# Patient Record
Sex: Female | Born: 1955
Health system: Southern US, Community
[De-identification: ages and names within clinical notes are randomized; demographics above are authoritative.]

## PROBLEM LIST (undated history)

## (undated) HISTORY — PX: APPENDECTOMY: SHX54

---

## 1999-10-20 ENCOUNTER — Ambulatory Visit (HOSPITAL_BASED_OUTPATIENT_CLINIC_OR_DEPARTMENT_OTHER): Admission: RE | Admit: 1999-10-20 | Discharge: 1999-10-20 | Payer: Self-pay | Admitting: *Deleted

## 2006-01-23 ENCOUNTER — Inpatient Hospital Stay (HOSPITAL_COMMUNITY): Admission: EM | Admit: 2006-01-23 | Discharge: 2006-01-26 | Payer: Self-pay | Admitting: Emergency Medicine

## 2007-03-15 IMAGING — CR DG ABDOMEN 1V
1 series · 1 of 1 positions shown · non-contrast
Comparison: None.

CLINICAL DATA: 49-year-old with nausea, vomiting.
 ABDOMEN - 1 VIEW:

[view not recorded]
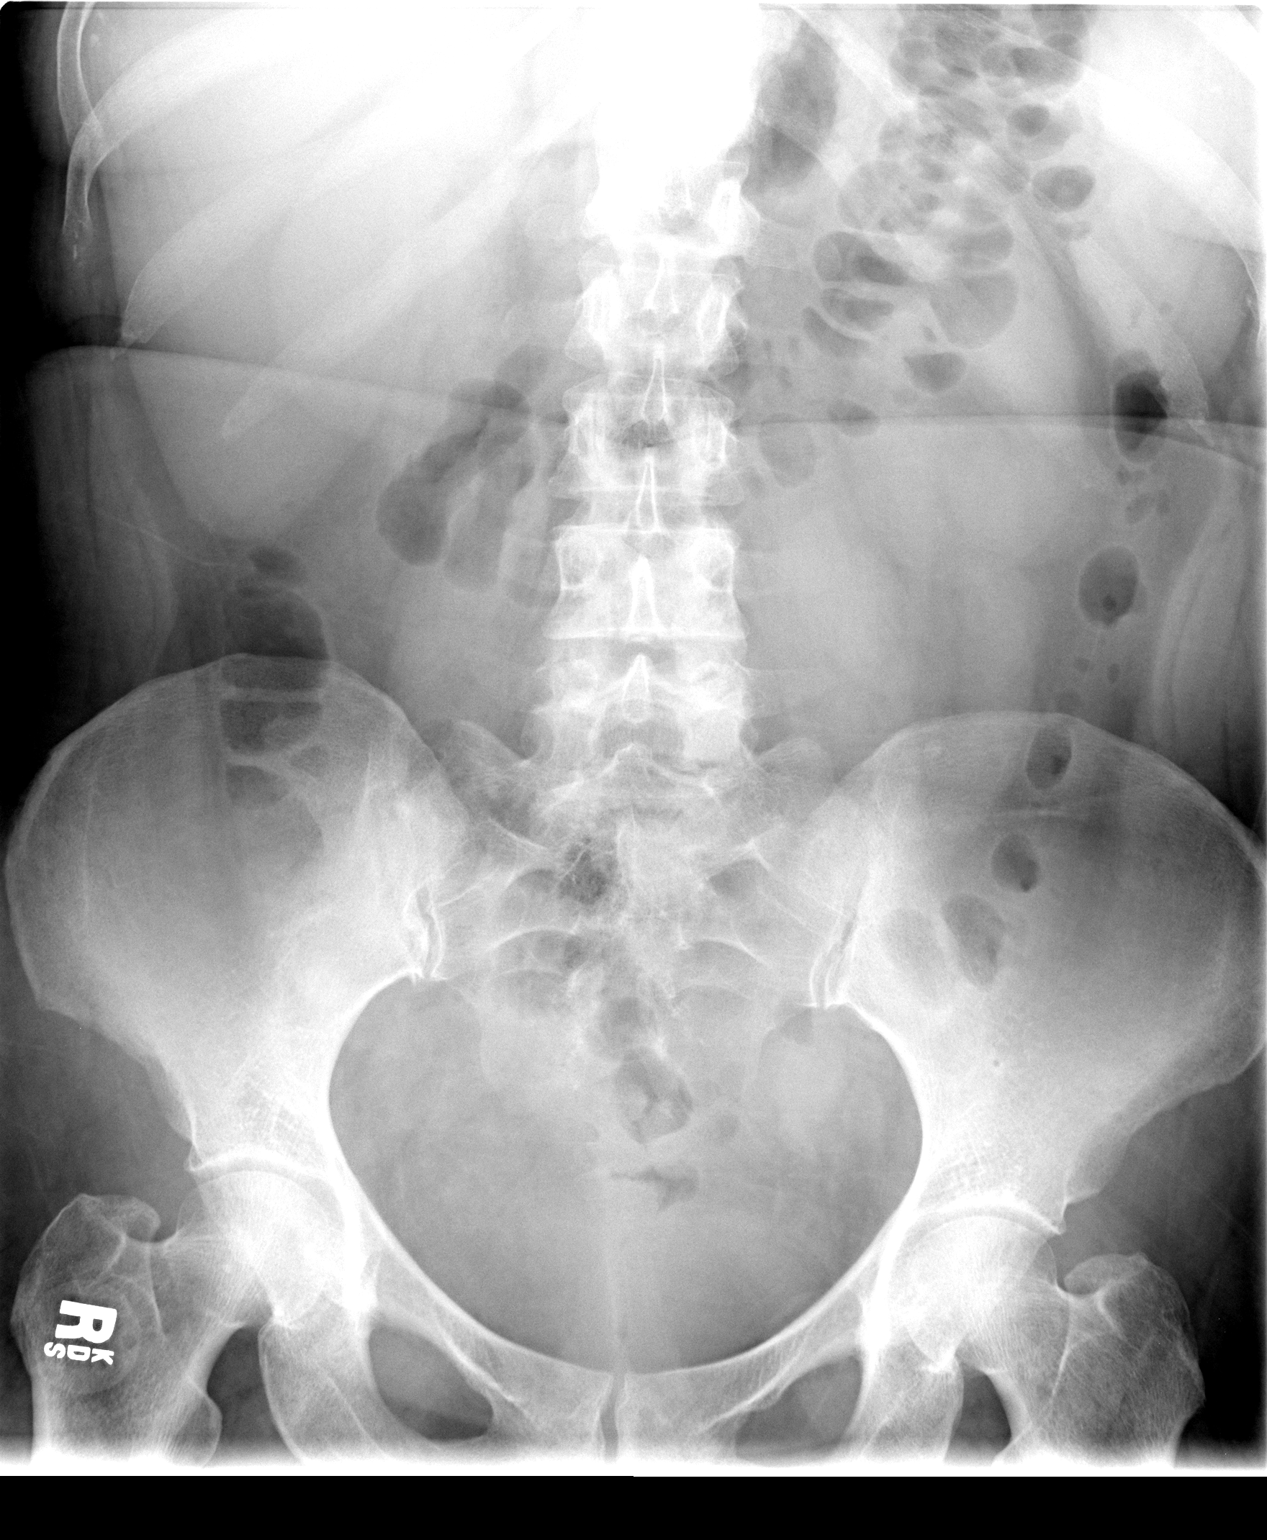

[1 of 1 positions shown; findings below may reference images not displayed]

FINDINGS: The bowel gas pattern is unremarkable.  There is scattered air in the colon.  No dilated loops of small bowel to suggest obstruction.  The soft tissue shadows of the abdomen are maintained.  No worrisome calcifications are seen.  Vague rounded density over the left iliac crest area may be a bone island.  It?s too lateral to be in the ureter.  Bony structures are unremarkable.
IMPRESSION: No plain film evidence of acute abdominal process.

## 2019-05-23 ENCOUNTER — Encounter (HOSPITAL_COMMUNITY): Payer: Self-pay | Admitting: Emergency Medicine

## 2019-05-23 ENCOUNTER — Emergency Department (HOSPITAL_COMMUNITY)
Admission: EM | Admit: 2019-05-23 | Discharge: 2019-05-23 | Disposition: A | Discharge status: Home / Self Care | Payer: 59 | Attending: Emergency Medicine | Admitting: Emergency Medicine

## 2019-05-23 ENCOUNTER — Other Ambulatory Visit: Payer: Self-pay

## 2019-05-23 DIAGNOSIS — L989 Disorder of the skin and subcutaneous tissue, unspecified: Secondary | ICD-10-CM | POA: Insufficient documentation

## 2019-05-23 DIAGNOSIS — R2242 Localized swelling, mass and lump, left lower limb: Secondary | ICD-10-CM | POA: Diagnosis present

## 2019-05-23 DIAGNOSIS — L280 Lichen simplex chronicus: Secondary | ICD-10-CM | POA: Insufficient documentation

## 2019-05-23 DIAGNOSIS — L03116 Cellulitis of left lower limb: Secondary | ICD-10-CM | POA: Insufficient documentation

## 2019-05-23 DIAGNOSIS — L539 Erythematous condition, unspecified: Secondary | ICD-10-CM | POA: Insufficient documentation

## 2019-05-23 LAB — COMPREHENSIVE METABOLIC PANEL
ALT: 18 U/L (ref 0–44)
AST: 17 U/L (ref 15–41)
Albumin: 4.2 g/dL (ref 3.5–5.0)
Alkaline Phosphatase: 117 U/L (ref 38–126)
Anion gap: 13 (ref 5–15)
BUN: 13 mg/dL (ref 8–23)
CO2: 18 mmol/L — ABNORMAL LOW (ref 22–32)
Calcium: 9.5 mg/dL (ref 8.9–10.3)
Chloride: 106 mmol/L (ref 98–111)
Creatinine, Ser: 1.11 mg/dL — ABNORMAL HIGH (ref 0.44–1.00)
GFR calc Af Amer: >60 mL/min (ref >60)
GFR calc non Af Amer: 53 mL/min — ABNORMAL LOW (ref >60)
Glucose, Bld: 129 mg/dL — ABNORMAL HIGH (ref 70–99)
Potassium: 4.1 mmol/L (ref 3.5–5.1)
Sodium: 137 mmol/L (ref 135–145)
Total Bilirubin: 0.9 mg/dL (ref 0.3–1.2)
Total Protein: 7.8 g/dL (ref 6.5–8.1)

## 2019-05-23 LAB — LACTIC ACID, PLASMA: Lactic Acid, Venous: 1.6 mmol/L (ref 0.5–1.9)

## 2019-05-23 LAB — CBC WITH DIFFERENTIAL/PLATELET
Abs Immature Granulocytes: 0.14 10*3/uL — ABNORMAL HIGH (ref 0.00–0.07)
Basophils Absolute: 0.1 10*3/uL (ref 0.0–0.1)
Basophils Relative: 1 %
Eosinophils Absolute: 0.1 10*3/uL (ref 0.0–0.5)
Eosinophils Relative: 1 %
HCT: 47.4 % — ABNORMAL HIGH (ref 36.0–46.0)
Hemoglobin: 15 g/dL (ref 12.0–15.0)
Immature Granulocytes: 1 %
Lymphocytes Relative: 15 %
Lymphs Abs: 2 10*3/uL (ref 0.7–4.0)
MCH: 27.3 pg (ref 26.0–34.0)
MCHC: 31.6 g/dL (ref 30.0–36.0)
MCV: 86.2 fL (ref 80.0–100.0)
Monocytes Absolute: 0.7 10*3/uL (ref 0.1–1.0)
Monocytes Relative: 5 %
Neutro Abs: 10.7 10*3/uL — ABNORMAL HIGH (ref 1.7–7.7)
Neutrophils Relative %: 77 %
Platelets: 406 10*3/uL — ABNORMAL HIGH (ref 150–400)
RBC: 5.5 MIL/uL — ABNORMAL HIGH (ref 3.87–5.11)
RDW: 13.7 % (ref 11.5–15.5)
WBC: 13.7 10*3/uL — ABNORMAL HIGH (ref 4.0–10.5)
nRBC: 0 % (ref 0.0–0.2)

## 2019-05-23 MED ORDER — SODIUM CHLORIDE 0.9% FLUSH: 3.0000 mL | Freq: Once | INTRAVENOUS | Status: DC

## 2019-05-23 MED ORDER — CLINDAMYCIN HCL 150 MG PO CAPS: 300.0000 mg | ORAL_CAPSULE | Freq: Three times a day (TID) | ORAL | 0 refills | Status: AC

## 2019-05-23 MED ORDER — CLINDAMYCIN HCL 150 MG PO CAPS
300.0000 mg | ORAL_CAPSULE | Freq: Once | ORAL | Status: AC
  Administered 2019-05-23: 15:00:00 300 mg via ORAL
  Filled 2019-05-23: qty 2

## 2019-05-23 NOTE — ED Notes (Signed)
Provider at bedside

## 2019-05-23 NOTE — ED Provider Notes (Signed)
[place] EMERGENCY DEPARTMENT Provider Note   CSN: [id number] Arrival date & time: 05/23/19  1035     History Chief Complaint  Patient presents with  . Cellulitis    Kathryn Schmitt is a 64 y.o. female.  HPI    Patient presents with concern of left lower extremity weeping, discharge, odor. Patient has a history of lymphedema bilaterally, though more substantially on the left. She has had worsening condition in general over the past months. She notes that she has been on several courses of antibiotics, most recently 1 month ago, Keflex. She now presents due to concern of increasing swelling, erythema, discharge from the left calf. This episode began about 1 week ago, has been persistent. No clear alleviating or exacerbating factors. No distal loss of function. No systemic complaints including fever, chills, nausea, vomiting, other discomfort. She typically gets her care through telemedicine during the pandemic, has not seen a wound care clinic.   History reviewed. No pertinent past medical history.  There are no problems to display for this patient.   Past Surgical History:  Procedure Laterality Date  . APPENDECTOMY     2007     OB History   No obstetric history on file.     No family history on file.  Social History   Tobacco Use  . Smoking status: Not on file  Substance Use Topics  . Alcohol use: Not on file  . Drug use: Not on file    Home Medications Prior to Admission medications   Not on File    Allergies    Patient has no allergy information on record.  Review of Systems   Review of Systems  Constitutional:       Per HPI, otherwise negative  HENT:       Per HPI, otherwise negative  Respiratory:       Per HPI, otherwise negative  Cardiovascular:       Per HPI, otherwise negative  Gastrointestinal: Negative for vomiting.  Endocrine:       Negative aside from HPI  Genitourinary:       Neg aside from HPI    Musculoskeletal:       Per HPI, otherwise negative  Skin: Positive for color change and wound.  Neurological: Negative for syncope.    Physical Exam Updated Vital Signs BP 137/80   Pulse 89   Temp 98.2 F (36.8 C) (Oral)   Resp 16   SpO2 100%   Physical Exam Vitals and nursing note reviewed.  Constitutional:      General: She is not in acute distress.    Appearance: She is well-developed.  HENT:     Head: Normocephalic and atraumatic.  Eyes:     Conjunctiva/sclera: Conjunctivae normal.  Cardiovascular:     Rate and Rhythm: Normal rate and regular rhythm.     Pulses: Normal pulses.  Pulmonary:     Effort: Pulmonary effort is normal. No respiratory distress.     Breath sounds: No stridor.  Abdominal:     General: There is no distension.  Musculoskeletal:       Legs:  Skin:    General: Skin is warm and dry.  Neurological:     Mental Status: She is alert and oriented to person, place, and time.     Cranial Nerves: No cranial nerve deficit.     ED Results / Procedures / Treatments   Labs (all labs ordered are listed, but only abnormal results are displayed)  Labs Reviewed  COMPREHENSIVE METABOLIC PANEL - Abnormal; Notable for the following components:      Result Value   CO2 18 (*)    Glucose, Bld 129 (*)    Creatinine, Ser 1.11 (*)    GFR calc non Af Amer 53 (*)    All other components within normal limits  CBC WITH DIFFERENTIAL/PLATELET - Abnormal; Notable for the following components:   WBC 13.7 (*)    RBC 5.50 (*)    HCT 47.4 (*)    Platelets 406 (*)    Neutro Abs 10.7 (*)    Abs Immature Granulocytes 0.14 (*)    All other components within normal limits  CULTURE, BLOOD (ROUTINE X 2)  CULTURE, BLOOD (ROUTINE X 2)  LACTIC ACID, PLASMA  LACTIC ACID, PLASMA    EKG None  Radiology No results found.  Procedures Procedures (including critical care time)  Medications Ordered in ED Medications  sodium chloride flush (NS) 0.9 % injection 3 mL (has  no administration in time range)    ED Course  I have reviewed the triage vital signs and the nursing notes.  Pertinent labs & imaging results that were available during my care of the patient were reviewed by me and considered in my medical decision making (see chart for details).    MDM Rules/Calculators/A&P                      1:54 PM Labs unremarkable. With no evidence for bacteremia or sepsis, only mild leukocytosis, no lactic acidosis, no fever, patient is appropriate forInitiation of oral therapy, different from what she has been using.  Patient has not yet seen wound care clinic, will receive referrals to do so as well.   Final Clinical Impression(s) / ED Diagnoses Final diagnoses:  Cellulitis of left lower extremity  Lichenification    Rx / DC Orders ED Discharge Orders         Ordered    clindamycin (CLEOCIN) 150 MG capsule  3 times daily     05/23/19 1357           Gerhard Munch, MD 05/23/19 1357

## 2019-05-23 NOTE — ED Triage Notes (Signed)
Pt endorses cellulitis in left leg that she has been battling for 6 months. States there is open wounds and weeping. Has completed abx but wound has not improved.

## 2019-05-23 NOTE — Discharge Instructions (Signed)
As discussed, it is very portly follow-up with our wound care center for appropriate ongoing management of your lower extremity wounds. Please take your prescribed antibiotics, monitor your condition carefully, and do not hesitate to return here for concerning changes.

## 2019-05-28 LAB — CULTURE, BLOOD (ROUTINE X 2)
Culture: NO GROWTH
Culture: NO GROWTH
Special Requests: ADEQUATE

## 2019-06-02 ENCOUNTER — Other Ambulatory Visit: Payer: Self-pay

## 2019-06-02 ENCOUNTER — Emergency Department (HOSPITAL_COMMUNITY)
Admission: EM | Admit: 2019-06-02 | Discharge: 2019-06-02 | Disposition: A | Discharge status: Home / Self Care | Payer: 59 | Attending: Emergency Medicine | Admitting: Emergency Medicine

## 2019-06-02 ENCOUNTER — Encounter (HOSPITAL_COMMUNITY): Payer: Self-pay

## 2019-06-02 DIAGNOSIS — Z20822 Contact with and (suspected) exposure to covid-19: Secondary | ICD-10-CM | POA: Diagnosis not present

## 2019-06-02 DIAGNOSIS — L03119 Cellulitis of unspecified part of limb: Secondary | ICD-10-CM

## 2019-06-02 DIAGNOSIS — I872 Venous insufficiency (chronic) (peripheral): Secondary | ICD-10-CM | POA: Insufficient documentation

## 2019-06-02 DIAGNOSIS — Z79899 Other long term (current) drug therapy: Secondary | ICD-10-CM | POA: Diagnosis not present

## 2019-06-02 DIAGNOSIS — R21 Rash and other nonspecific skin eruption: Secondary | ICD-10-CM | POA: Diagnosis present

## 2019-06-02 LAB — CBC WITH DIFFERENTIAL/PLATELET
Abs Immature Granulocytes: 0.1 10*3/uL — ABNORMAL HIGH (ref 0.00–0.07)
Basophils Absolute: 0.1 10*3/uL (ref 0.0–0.1)
Basophils Relative: 0 %
Eosinophils Absolute: 0 10*3/uL (ref 0.0–0.5)
Eosinophils Relative: 0 %
HCT: 41.7 % (ref 36.0–46.0)
Hemoglobin: 13.3 g/dL (ref 12.0–15.0)
Immature Granulocytes: 1 %
Lymphocytes Relative: 8 %
Lymphs Abs: 0.9 10*3/uL (ref 0.7–4.0)
MCH: 27.1 pg (ref 26.0–34.0)
MCHC: 31.9 g/dL (ref 30.0–36.0)
MCV: 85.1 fL (ref 80.0–100.0)
Monocytes Absolute: 0.7 10*3/uL (ref 0.1–1.0)
Monocytes Relative: 6 %
Neutro Abs: 9.8 10*3/uL — ABNORMAL HIGH (ref 1.7–7.7)
Neutrophils Relative %: 85 %
Platelets: 234 10*3/uL (ref 150–400)
RBC: 4.9 MIL/uL (ref 3.87–5.11)
RDW: 14.1 % (ref 11.5–15.5)
WBC: 11.6 10*3/uL — ABNORMAL HIGH (ref 4.0–10.5)
nRBC: 0 % (ref 0.0–0.2)

## 2019-06-02 LAB — SARS CORONAVIRUS 2 (TAT 6-24 HRS): SARS Coronavirus 2: NEGATIVE

## 2019-06-02 LAB — BASIC METABOLIC PANEL
Anion gap: 11 (ref 5–15)
BUN: 14 mg/dL (ref 8–23)
CO2: 19 mmol/L — ABNORMAL LOW (ref 22–32)
Calcium: 8.6 mg/dL — ABNORMAL LOW (ref 8.9–10.3)
Chloride: 99 mmol/L (ref 98–111)
Creatinine, Ser: 1.08 mg/dL — ABNORMAL HIGH (ref 0.44–1.00)
GFR calc Af Amer: >60 mL/min (ref >60)
GFR calc non Af Amer: 55 mL/min — ABNORMAL LOW (ref >60)
Glucose, Bld: 143 mg/dL — ABNORMAL HIGH (ref 70–99)
Potassium: 4.1 mmol/L (ref 3.5–5.1)
Sodium: 129 mmol/L — ABNORMAL LOW (ref 135–145)

## 2019-06-02 LAB — LACTIC ACID, PLASMA: Lactic Acid, Venous: 1.2 mmol/L (ref 0.5–1.9)

## 2019-06-02 MED ORDER — BACITRACIN ZINC 500 UNIT/GM EX OINT: TOPICAL_OINTMENT | Freq: Once | CUTANEOUS | Status: AC

## 2019-06-02 MED ORDER — TRIAMCINOLONE ACETONIDE 0.1 % EX CREA: 1.0000 "application " | TOPICAL_CREAM | Freq: Two times a day (BID) | CUTANEOUS | 0 refills | Status: AC

## 2019-06-02 MED ORDER — DOXYCYCLINE HYCLATE 100 MG PO CAPS: 100.0000 mg | ORAL_CAPSULE | Freq: Two times a day (BID) | ORAL | 0 refills | Status: AC

## 2019-06-02 NOTE — ED Triage Notes (Signed)
Patient complains of general body aches and rash on right foot x 2 days. States that she thinks she is having allergic reaction to clindamycin. Finished med on Friday, NAD

## 2019-06-02 NOTE — Discharge Instructions (Signed)
You are seen today for skin changes in your lower legs.  Although your white blood cell count looked improved from your last visit, I think that you still may have an infection and so I have started you on doxycycline.  I have also given you a topical steroid medication which can help with some of the skin changes as well.  Please make sure you see the wound clinic in 1 week as you are scheduled.Thank you for allowing me to care for you today. Please return to the emergency department if you have new or worsening symptoms. Take your medications as instructed.

## 2019-06-02 NOTE — ED Provider Notes (Addendum)
[facility] EMERGENCY DEPARTMENT Provider Note   CSN: [id number] Arrival date & time: 06/02/19  8099     History Chief Complaint  Patient presents with  . possible allergic rx/ rash    Kathryn Schmitt is a 64 y.o. female.  Patient is a 63 year old female with no listed past medical history presenting to the emergency department for bilateral lower extremity swelling and weeping.  Patient reports that this has been going on for several months.  She reports that she does not have a primary care doctor and has been on a few courses of antibiotics which were prescribed by telemedicine as well as most recently on clindamycin after a visit here to the emergency department on 22 January.  She reports that she completed the clindamycin on Friday and this morning she noticed a rash on the dorsum of her right foot.  She does state that overall the swelling and erythema looks much better after the clindamycin but it is still present with weeping.  Reports that the left side swelling and weeping has been worse than the right side for several months.  She reports that the rash on the right foot is new and she also has been feeling more generally weak and fatigued.  Reports a slight cough but denies any recorded fevers, chills, nausea, vomiting, chest pain, shortness of breath.  No known Covid exposure.        History reviewed. No pertinent past medical history.  There are no problems to display for this patient.   Past Surgical History:  Procedure Laterality Date  . APPENDECTOMY     2007     OB History   No obstetric history on file.     No family history on file.  Social History   Tobacco Use  . Smoking status: Not on file  Substance Use Topics  . Alcohol use: Not on file  . Drug use: Not on file    Home Medications Prior to Admission medications   Medication Sig Start Date End Date Taking? Authorizing Provider  acetaminophen (TYLENOL) 500 MG tablet  Take 1,000 mg by mouth every 6 (six) hours as needed for mild pain.   Yes [provider]  clindamycin (CLEOCIN) 150 MG capsule Take 300 mg by mouth 3 (three) times daily.   Yes [provider]  Probiotic Product (PROBIOTIC ACIDOPHILUS BEADS) CAPS Take 1 capsule by mouth daily.   Yes [provider]  doxycycline (VIBRAMYCIN) 100 MG capsule Take 1 capsule (100 mg total) by mouth 2 (two) times daily. 06/02/19   Ronnie Doss A, PA-C  triamcinolone cream (KENALOG) 0.1 % Apply 1 application topically 2 (two) times daily. 06/02/19   Arlyn Dunning, PA-C    Allergies    Patient has no known allergies.  Review of Systems   Review of Systems  Constitutional: Positive for fatigue. Negative for appetite change, chills, diaphoresis and fever.  HENT: Negative for congestion and sore throat.   Respiratory: Positive for cough. Negative for shortness of breath.   Cardiovascular: Negative for chest pain.  Gastrointestinal: Negative for abdominal pain, nausea and vomiting.  Genitourinary: Negative for dysuria.  Musculoskeletal: Negative for arthralgias, back pain, gait problem and myalgias.  Skin: Positive for color change, rash and wound. Negative for pallor.  Allergic/Immunologic: Negative for immunocompromised state.  Neurological: Positive for weakness (Generalized).  Hematological: Does not bruise/bleed easily.    Physical Exam Updated Vital Signs BP 122/75 (BP Location: Right Arm)   Pulse 90  Temp (!) 97.5 F (36.4 C) (Oral)   Resp 20   SpO2 99%   Physical Exam Vitals and nursing note reviewed.  Constitutional:      General: She is not in acute distress.    Appearance: Normal appearance. She is obese. She is not ill-appearing, toxic-appearing or diaphoretic.  HENT:     Head: Normocephalic.     Mouth/Throat:     Mouth: Mucous membranes are moist.  Eyes:     Conjunctiva/sclera: Conjunctivae normal.  Pulmonary:     Effort: Pulmonary effort is normal. No  respiratory distress.  Musculoskeletal:     Right lower leg: Edema present.     Left lower leg: Edema present.     Comments: Bilateral severe lower extremity edema, worse on the left.  Left lower extremity with weeping edema and erythema and venous stasis appearing skin changes with scaling and excoriations.  Malodorous yellow weeping.  The right dorsum of the foot has a few scattered, blanching macules with surrounding erythema.  Bilateral lower extremity pulses are present with warm and perfused toes.  No blistering, sloughing of the skin  Skin:    General: Skin is dry.     Capillary Refill: Capillary refill takes less than 2 seconds.  Neurological:     Mental Status: She is alert.     Sensory: No sensory deficit.  Psychiatric:        Mood and Affect: Mood normal.     ED Results / Procedures / Treatments   Labs (all labs ordered are listed, but only abnormal results are displayed) Labs Reviewed  BASIC METABOLIC PANEL - Abnormal; Notable for the following components:      Result Value   Sodium 129 (*)    CO2 19 (*)    Glucose, Bld 143 (*)    Creatinine, Ser 1.08 (*)    Calcium 8.6 (*)    GFR calc non Af Amer 55 (*)    All other components within normal limits  CBC WITH DIFFERENTIAL/PLATELET - Abnormal; Notable for the following components:   WBC 11.6 (*)    Neutro Abs 9.8 (*)    Abs Immature Granulocytes 0.10 (*)    All other components within normal limits  SARS CORONAVIRUS 2 (TAT 6-24 HRS)  LACTIC ACID, PLASMA    EKG None  Radiology No results found.  Procedures Procedures (including critical care time)  Medications Ordered in ED Medications  bacitracin ointment ( Topical Given 06/02/19 1128)    ED Course  I have reviewed the triage vital signs and the nursing notes.  Pertinent labs & imaging results that were available during my care of the patient were reviewed by me and considered in my medical decision making (see chart for details).  Clinical Course as  of Jun 05 838  Mon Jun 02, 2019  1145 Patient presenting with follow-up of lower leg cellulitis and new rash on the right foot.  Patient recently on clindamycin for cellulitis.  Reports erythema is actually improved but has new rash on the right dorsum of her foot.  Does not appear to be a severe drug reaction.  She does have chronic venous stasis skin changes.  Lab results reveal an improved white blood cell count but still a mild leukocytosis of 11.6.  Today she is hyponatremic to 129.  Remainder of her labs are baseline.  She does not have a listed history of diabetes but her glucose is 143 today.  Lactic acid is normal.  Due to her  cough a Covid swab was obtained.  This is a send out lab.  Patient has follow-up with wound care on next Monday.  I am going to start her on a topical steroid for venous stasis as well as doxycycline as she is still having foul-smelling drainage.  Her wounds were cleaned and dressed here in the emergency department.  Patient was also advised of her additional labs including low sodium, mildly elevated creatinine and elevated glucose.  Advised to follow-up with primary care doctor to follow these.  She was advised on strict return precautions.     [KM]    Clinical Course User Index [KM] Kristine Royal   MDM Rules/Calculators/A&P                      Based on review of vitals, medical screening exam, lab work and/or imaging, there does not appear to be an acute, emergent etiology for the patient's symptoms. Counseled pt on good return precautions and encouraged both PCP and ED follow-up as needed.  Prior to discharge, I also discussed incidental imaging findings with patient in detail and advised appropriate, recommended follow-up in detail.  Clinical Impression: 1. Cellulitis of lower extremity, unspecified laterality   2. Venous stasis dermatitis of both lower extremities     Disposition: Discharge  Prior to providing a prescription for a controlled  substance, I independently reviewed the patient's recent prescription history on the [place]. The patient had no recent or regular prescriptions and was deemed appropriate for a brief, less than 3 day prescription of narcotic for acute analgesia.  This note was prepared with assistance of Systems analyst. Occasional wrong-word or sound-a-like substitutions may have occurred due to the inherent limitations of voice recognition software.  Final Clinical Impression(s) / ED Diagnoses Final diagnoses:  Cellulitis of lower extremity, unspecified laterality  Venous stasis dermatitis of both lower extremities    Rx / DC Orders ED Discharge Orders         Ordered    triamcinolone cream (KENALOG) 0.1 %  2 times daily     06/02/19 1224    doxycycline (VIBRAMYCIN) 100 MG capsule  2 times daily     06/02/19 [address] 06/02/19 1225    [address], PA-C 06/06/19 1610    Charlesetta Shanks, MD 06/09/19 [phone]

## 2019-06-09 ENCOUNTER — Encounter (HOSPITAL_BASED_OUTPATIENT_CLINIC_OR_DEPARTMENT_OTHER): Payer: 59 | Attending: Internal Medicine | Admitting: Internal Medicine

## 2019-06-09 ENCOUNTER — Other Ambulatory Visit: Payer: Self-pay

## 2019-06-09 DIAGNOSIS — Z836 Family history of other diseases of the respiratory system: Secondary | ICD-10-CM | POA: Diagnosis not present

## 2019-06-09 DIAGNOSIS — L97221 Non-pressure chronic ulcer of left calf limited to breakdown of skin: Secondary | ICD-10-CM | POA: Insufficient documentation

## 2019-06-09 DIAGNOSIS — Z823 Family history of stroke: Secondary | ICD-10-CM | POA: Insufficient documentation

## 2019-06-09 DIAGNOSIS — I10 Essential (primary) hypertension: Secondary | ICD-10-CM | POA: Insufficient documentation

## 2019-06-09 DIAGNOSIS — Z881 Allergy status to other antibiotic agents status: Secondary | ICD-10-CM | POA: Insufficient documentation

## 2019-06-09 DIAGNOSIS — I890 Lymphedema, not elsewhere classified: Secondary | ICD-10-CM | POA: Diagnosis not present

## 2019-06-09 DIAGNOSIS — L97211 Non-pressure chronic ulcer of right calf limited to breakdown of skin: Secondary | ICD-10-CM | POA: Insufficient documentation

## 2019-06-09 DIAGNOSIS — I252 Old myocardial infarction: Secondary | ICD-10-CM | POA: Diagnosis not present

## 2019-06-09 DIAGNOSIS — Z6841 Body Mass Index (BMI) 40.0 and over, adult: Secondary | ICD-10-CM | POA: Insufficient documentation

## 2019-06-09 DIAGNOSIS — D649 Anemia, unspecified: Secondary | ICD-10-CM | POA: Insufficient documentation

## 2019-06-09 DIAGNOSIS — I872 Venous insufficiency (chronic) (peripheral): Secondary | ICD-10-CM | POA: Insufficient documentation

## 2019-06-09 DIAGNOSIS — Z809 Family history of malignant neoplasm, unspecified: Secondary | ICD-10-CM | POA: Diagnosis not present

## 2019-06-09 NOTE — Progress Notes (Signed)
[name], [name] ([id number]) Visit Report for 06/09/2019 Abuse/Suicide Risk Screen Details Date of Service: Kathryn Schmitt, Kathryn Schmitt 06/09/2019 2:45 PM [name] Patient Account Number: [id number] Medical Record Treating RN: Yevonne Pax [phone] Number: Other Clinician: Date of Birth/Sex: 11/03/1955 (64 y.o. F) Treating Sanjuana Mruk/Extender:Robson, Casimiro Needle Primary Care Surena Welge: PATIENT, NO Referring Maelani Yarbro: Weeks in Treatment: 0 Abuse/Suicide Risk Screen Items Answer ABUSE RISK SCREEN: Has anyone close to you tried to hurt or harm you recentlyo No Do you feel uncomfortable with anyone in your familyo No Has anyone forced you do things that you didnt want to doo No Electronic Signature(s) Signed: 06/09/2019 5:29:28 PM By: Yevonne Pax RN Entered By: Yevonne Pax on 06/09/2019 15:19:21 Activities of Daily Living Details Date of Service: -------------------------------------------------------------------------------- Kathryn, Schmitt 06/09/2019 2:45 PM [name] Patient Account Number: [id number] Medical Record Treating RN: Yevonne Pax [phone] Number: Other Clinician: Date of Birth/Sex: 09/07/1955 (64 y.o. F) Treating Kathryn Schmitt/Extender:Robson, Casimiro Needle Primary Care Kathryn Schmitt: PATIENT, NO Referring Athony Coppa: Tania Ade in Treatment: 0 Activities of Daily Living Items Answer Activities of Daily Living (Please select one for each item) Drive Automobile Completely Able Take Medications Completely Able Use Telephone Completely Able Care for Appearance Completely Able Use Toilet Completely Able Bath / Shower Completely Able Dress Self Completely Able Feed Self Completely Able Walk Completely Able Get In / Out Bed Completely Able Housework Completely Able Prepare Meals Completely Able Handle Money Completely Able Shop for Self Completely Able Electronic Signature(s) Signed: 06/09/2019 5:29:28 PM By: Yevonne Pax RN Entered By: Yevonne Pax on 06/09/2019  15:19:46 Education Screening Details Date of Service: -------------------------------------------------------------------------------- Kathryn Schmitt 06/09/2019 2:45 PM [name] Patient Account Number: [id number] Medical Record Treating RN: Yevonne Pax [phone] Number: Other Clinician: Date of Birth/Sex: July 14, 1955 (64 y.o. F) Treating Kathryn Schmitt/Extender:Robson, Casimiro Needle Primary Care Kathryn Schmitt: PATIENT, NO Referring Kathryn Schmitt: Tania Ade in Treatment: 0 Primary Learner Assessed: Patient Learning Preferences/Education Level/Primary Language Learning Preference: Explanation Highest Education Level: High School Preferred Language: English Cognitive Barrier Language Barrier: No Translator Needed: No Memory Deficit: No Emotional Barrier: No Cultural/Religious Beliefs Affecting Medical Care: No Physical Barrier Impaired Vision: Yes Glasses Impaired Hearing: No Decreased Hand dexterity: No Knowledge/Comprehension Knowledge Level: Medium Comprehension Level: High Ability to understand written High instructions: Ability to understand verbal High instructions: Motivation Anxiety Level: Calm Cooperation: Cooperative Education Importance: Acknowledges Need Interest in Health Problems: Asks Questions Perception: Coherent Willingness to Engage in Self- High Management Activities: Readiness to Engage in Self- High Management Activities: Electronic Signature(s) Signed: 06/09/2019 5:29:28 PM By: Yevonne Pax RN Entered By: Yevonne Pax on 06/09/2019 15:20:20 Fall Risk Assessment Details Date of Service: -------------------------------------------------------------------------------- Kathryn Schmitt 06/09/2019 2:45 PM [name] Patient Account Number: [id number] Medical Record Treating RN: Yevonne Pax [phone] Number: Other Clinician: Date of Birth/Sex: [birth year] (64 y.o. F) Treating Clovia Reine/Extender:Robson, Casimiro Needle Primary Care Kathryn Schmitt: PATIENT,  NO Referring Kathryn Schmitt: Tania Ade in Treatment: 0 Fall Risk Assessment Items Have you had 2 or more falls in the last 12 monthso 0 No Have you had any fall that resulted in injury in the last 12 monthso 0 No FALLS RISK SCREEN History of falling - immediate or within 3 months 0 No Secondary diagnosis (Do you have 2 or more medical diagnoseso) 0 No Ambulatory aid None/bed rest/wheelchair/nurse 0 No Crutches/cane/walker 0 No Furniture 0 No Intravenous therapy Access/Saline/Heparin Lock 0 No Gait/Transferring Normal/ bed rest/ wheelchair 0 No Impaired (short steps with shuffle, may have difficulty arising from chair, 0 No head down, impaired balance) Mental Status Oriented to own ability 0 No Overestimates  or forgets limitations 0 No Risk Level: Low Risk Score: 0 Electronic Signature(s) Signed: 06/09/2019 5:29:28 PM By: Carlene Coria RN Entered By: Carlene Coria on 06/09/2019 15:20:29 Foot Assessment Details Date of Service: -------------------------------------------------------------------------------- Kathryn Schmitt 06/09/2019 2:45 PM [name] Patient Account Number: [id number] Medical Record Treating RN: Carlene Coria [phone] Number: Other Clinician: Date of Birth/Sex: 10/01/1955 (64 y.o. F) Treating Kathryn Schmitt/Extender:Robson, Legrand Como Primary Care Machele Deihl: PATIENT, NO Referring Sonam Huelsmann: Weeks in Treatment: 0 Foot Assessment Items Site Locations + = Sensation present, - = Sensation absent, C = Callus, U = Ulcer R = Redness, W = Warmth, M = Maceration, PU = Pre-ulcerative lesion F = Fissure, S = Swelling, D = Dryness Assessment Right: Left: Other Deformity: No No Prior Foot Ulcer: No No Prior Amputation: No No Charcot Joint: No No Ambulatory Status: Ambulatory Without Help Gait: Steady Electronic Signature(s) Signed: 06/09/2019 5:29:28 PM By: Carlene Coria RN Entered By: Carlene Coria on 06/09/2019 15:24:45 Nutrition Risk Screening Details Date of  Service: -------------------------------------------------------------------------------- Kathryn Schmitt 06/09/2019 2:45 PM [name] Patient Account Number: [id number] Medical Record Treating RN: Carlene Coria [phone] Number: Other Clinician: Date of Birth/Sex: 1955/07/22 (63 y.o. F) Treating Riyah Bardon/Extender:Robson, Legrand Como Primary Care Makhi Muzquiz: PATIENT, NO Referring Jaydon Avina: Weeks in Treatment: 0 Height (in): 60 Weight (lbs): 280 Body Mass Index (BMI): 54.7 Nutrition Risk Screening Items Score Screening NUTRITION RISK SCREEN: I have an illness or condition that made me change the kind and/or 0 No amount of food I eat I eat fewer than two meals per day 0 No I eat few fruits and vegetables, or milk products 0 No I have three or more drinks of beer, liquor or wine almost every day 0 No I have tooth or mouth problems that make it hard for me to eat 0 No I don't always have enough money to buy the food I need 0 No I eat alone most of the time 0 No I take three or more different prescribed or over-the-counter drugs a day 1 Yes 0 No Without wanting to, I have lost or gained 10 pounds in the last six months I am not always physically able to shop, cook and/or feed myself 0 No Nutrition Protocols Good Risk Protocol 0 No interventions needed Moderate Risk Protocol High Risk Proctocol Risk Level: Good Risk Score: 1 Electronic Signature(s) Signed: 06/09/2019 5:29:28 PM By: Carlene Coria RN Entered By: Carlene Coria on 06/09/2019 15:20:48

## 2019-06-10 NOTE — Progress Notes (Signed)
Kathryn Schmitt, [name] ([id number]) Visit Report for 06/09/2019 Chief Complaint Document Details Date of Service: Kathryn Schmitt, Kathryn Schmitt 06/09/2019 2:45 PM [name] Patient Account Number: [id number] Medical Record Treating RN: [id number] Number: Other Clinician: Date of Birth/Sex: 08/13/1955 (63 y.o. Female) Treating Provider/Extender:Kathryn Schmitt Primary Care Provider: PATIENT, NO Referring Provider: Tania AdeWeeks in Treatment: 0 Information Obtained from: Patient Chief Complaint 06/09/2019; patient is here for review of wounds on the bilateral lower legs Electronic Signature(s) Signed: 06/09/2019 6:01:37 PM By: Baltazar Najjarobson, Genea Rheaume MD Entered By: Baltazar Najjarobson, Brytnee Bechler on 06/09/2019 16:26:55 HPI Details Date of Service: -------------------------------------------------------------------------------- Kathryn Schmitt, Kathryn Schmitt 06/09/2019 2:45 PM [name] Patient Account Number: [id number] Medical Record Treating RN: [id number] Number: Other Clinician: Date of Birth/Sex: 10/13/1955 14(63 y.o. Female) Treating Provider/Extender:Randie Tallarico, Casimiro Schmitt Primary Care Provider: PATIENT, NO Referring Provider: Tania AdeWeeks in Treatment: 0 History of Present Illness HPI Description: ADMISSION 06/09/2019 This is a [age] year old woman who comes in for review of wounds on her bilateral lower extremities. The exact duration of this is unclear however she was in the ER on 05/23/2019 with left lower extremity weeping and references to the fact that she had several courses of antibiotics through telehealth. They gave her clindamycin. The clindamycin on a 7-day course she finished about 10 days ago and then 2 days later developed a diffuse rash. She was back in the ER on 06/02/2019 given triamcinolone for the rash and doxycycline but she has not taken this. Past medical history this patient has bilateral lymphedema left greater than right. She says this started a year ago although I suspect it was there a lot longer than that. She  has anemia. She does not have a primary care physician and has not seen a doctor outside of the ER in more than 6 years. She is not a known diabetic ABIs in our clinic were 0.94 on the right we could not pain this on the left Electronic Signature(s) Signed: 06/09/2019 6:01:37 PM By: Baltazar Najjarobson, Patrina Andreas MD Entered By: Baltazar Najjarobson, Yasmeen Manka on 06/09/2019 16:29:06 Physical Exam Details Date of Service: -------------------------------------------------------------------------------- Kathryn Schmitt, Kathryn Schmitt 06/09/2019 2:45 PM [name] Patient Account Number: [id number] Medical Record Treating RN: [id number] Number: Other Clinician: Date of Birth/Sex: 08/23/1955 [age] y.o. Female) Treating Provider/Extender:Vyolet Sakuma, Casimiro Schmitt Primary Care Provider: PATIENT, NO Referring Provider: Weeks in Treatment: 0 Constitutional Sitting or standing Blood Pressure is within target range for patient.. Pulse regular and within target range for patient.Marland Kitchen. Respirations regular, non-labored and within target range.. Morbidly obese woman of alert and conversational. Respiratory work of breathing is normal. Bilateral breath sounds are clear and equal in all lobes with no wheezes, rales or rhonchi.. Cardiovascular Heart rhythm and rate regular, without murmur or gallop.. Dorsalis pedis pulses are palpable bilaterally. Severe nonpitting edema left greater than right below the knees. Gastrointestinal (GI) . Integumentary (Hair, Skin) Widespread rash that does look like a drug eruption as her neck and face morbidly obese nontender wound exam Most of the problem is on the left posterior calf.Marland Kitchen. Psychiatric appears at normal baseline. Notes Most of the problem is on the left posterior calf. Wide areas of denuded epithelium nonviable scaling callus and epidermis. This was vigorously wiped off with Anasept and gauze. She has a more superficial wound on the left anterior mid tibia area. Small areas on the right medial calf. The  patient has severe bilateral left greater than right lymphedema Electronic Signature(s) Signed: 06/09/2019 6:01:37 PM By: Baltazar Najjarobson, Rhian Asebedo MD Entered By: Baltazar Najjarobson, Danzel Marszalek on 06/09/2019 16:31:36 Physician Orders Details Date of Service: -------------------------------------------------------------------------------- Kathryn Schmitt, Kathryn Schmitt 06/09/2019 2:45  PM [name] Patient Account Number: [id number] Medical Record Treating RN: Levan Hurst [id number] Number: Other Clinician: Date of Birth/Sex: [birth year] (64 y.o. Female) Treating Provider/Extender:Hurley Blevins, Legrand Como Primary Care Provider: PATIENT, NO Referring Provider: Weeks in Treatment: 0 Verbal / Phone Orders: No Diagnosis Coding Follow-up Appointments Return Appointment in 1 week. Dressing Change Frequency Do not change entire dressing for one week. - all leg wounds Wound #4 Left Toe Great Change dressing every day. Skin Barriers/Peri-Wound Care Moisturizing lotion - both legs mixed with TCA TCA Cream or Ointment - both legs liberally mixed with lotion Wound Cleansing May shower with protection. - may use cast protector Primary Wound Dressing Wound #1 Left,Medial Lower Leg Calcium Alginate with Silver Wound #2 Left,Posterior Lower Leg Calcium Alginate with Silver Wound #3 Right,Medial Lower Leg Calcium Alginate with Silver Wound #4 Left Toe Great Other: - Polysporin Secondary Dressing Wound #1 Left,Medial Lower Leg ABD pad Zetuvit or Kerramax Wound #2 Left,Posterior Lower Leg ABD pad Zetuvit or Kerramax Wound #3 Right,Medial Lower Leg ABD pad Wound #4 Left Toe Great Other: - Bandaid Edema Control 4 layer compression - Bilateral - may use Unna boot if needed at top half of wrap to help prevent wrap from rolling down Avoid standing for long periods of time Elevate legs to the level of the heart or above for 30 minutes daily and/or when sitting, a frequency of: - frequently throughout the day Exercise  regularly Electronic Signature(s) Signed: 06/09/2019 6:01:37 PM By: Linton Ham MD Signed: 06/10/2019 5:30:42 PM By: Levan Hurst RN, BSN Entered By: Levan Hurst on 06/09/2019 16:22:25 Problem List Details Date of Service: -------------------------------------------------------------------------------- Kathryn Schmitt 06/09/2019 2:45 PM [name] Patient Account Number: [id number] Medical Record Treating RN: [id number] Number: Other Clinician: Date of Birth/Sex: 1955-07-03 (63 y.o. 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 Patient Account Number: [id number] Medical Record Treating RN: [id number] Number: Other Clinician: Date of Birth/Sex: Oct 11, 1955 (64 y.o. Female) Treating Provider/Extender:Yexalen Deike, Legrand Como Primary Care Provider: PATIENT, NO Referring Provider: Suella Grove in Treatment: 0 Subjective Chief Complaint Information obtained from Patient 06/09/2019; patient is here for review of wounds on the bilateral lower legs History of Present Illness (HPI) ADMISSION 06/09/2019 This is a 63 year old woman who comes in for review of wounds on her bilateral lower extremities. The exact duration of this is unclear  however she was in the ER on 05/23/2019 with left lower extremity weeping and references to the fact that she had several courses of antibiotics through telehealth. They gave her clindamycin. The clindamycin on a 7-day course she finished about 10 days ago and then 2 days later developed a diffuse rash. She was back in the ER on 06/02/2019 given triamcinolone for the rash and doxycycline but she has not taken this. Past medical history this patient has bilateral lymphedema left greater than right. She says this started a year ago although I suspect it was there a lot longer than that. She has anemia. She does not have  a primary care physician and has not seen a doctor outside of the ER in more than 6 years. She is not a known diabetic ABIs in our clinic were 0.94 on the right we could not pain this on the left Patient History Information obtained from Patient. Allergies clindamycin Family History Cancer - Father,Siblings, Lung Disease - Father, Stroke - Maternal Grandparents, No family history of Diabetes, Heart Disease, Hereditary Spherocytosis, Hypertension, Kidney Disease, Seizures, Thyroid Problems, Tuberculosis. Social History Never smoker, Marital Status - Divorced, Alcohol Use - Never, Drug Use - No History, Caffeine Use - Daily. Medical History Eyes Denies history of Cataracts, Glaucoma Ear/Nose/Mouth/Throat Denies history of Chronic sinus problems/congestion, Middle ear problems Hematologic/Lymphatic Patient has history of Lymphedema Denies history of Anemia, Hemophilia, Human Immunodeficiency Virus, Sickle Cell Disease Respiratory Denies history of Aspiration, Asthma, Chronic Obstructive Pulmonary Disease (COPD), Pneumothorax, Sleep Apnea, Tuberculosis Cardiovascular Denies history of Angina, Arrhythmia, Congestive Heart Failure, Coronary Artery Disease, Deep Vein Thrombosis, Hypertension, Hypotension, Myocardial Infarction, Peripheral Arterial Disease, Peripheral Venous  Disease, Phlebitis, Vasculitis Gastrointestinal Denies history of Cirrhosis , Colitis, Crohnoos, Hepatitis A, Hepatitis B, Hepatitis C Endocrine Denies history of Type I Diabetes, Type II Diabetes Genitourinary Denies history of End Stage Renal Disease Immunological Denies history of Lupus Erythematosus, Raynaudoos, Scleroderma Integumentary (Skin) Denies history of History of Burn Musculoskeletal Denies history of Gout, Rheumatoid Arthritis, Osteoarthritis, Osteomyelitis Neurologic Denies history of Dementia, Neuropathy, Quadriplegia, Paraplegia, Seizure Disorder Oncologic Denies history of Received Chemotherapy, Received Radiation Psychiatric Denies history of Anorexia/bulimia, Confinement Anxiety Review of Systems (ROS) Constitutional Symptoms (General Health) Denies complaints or symptoms of Fatigue, Fever, Chills, Marked Weight Change. Eyes Complains or has symptoms of Glasses / Contacts. Denies complaints or symptoms of Dry Eyes, Vision Changes. Ear/Nose/Mouth/Throat Denies complaints or symptoms of Chronic sinus problems or rhinitis. Respiratory Denies complaints or symptoms of Chronic or frequent coughs, Shortness of Breath. Cardiovascular Denies complaints or symptoms of Chest pain. Gastrointestinal Denies complaints or symptoms of Frequent diarrhea, Nausea, Vomiting. Endocrine Denies complaints or symptoms of Heat/cold intolerance. Genitourinary Denies complaints or symptoms of Frequent urination. Integumentary (Skin) Complains or has symptoms of Wounds. Musculoskeletal Denies complaints or symptoms of Muscle Pain, Muscle Weakness. Neurologic Denies complaints or symptoms of Numbness/parasthesias. Psychiatric Denies complaints or symptoms of Claustrophobia, Suicidal. Objective Constitutional Sitting or standing Blood Pressure is within target range for patient.. Pulse regular and within target range for patient.Marland Kitchen Respirations regular, non-labored and  within target range.. Morbidly obese woman of alert and conversational. Vitals Time Taken: 3:13 PM, Height: 60 in, Source: Stated, Weight: 280 lbs, Source: Stated, BMI: 54.7, Temperature: 98.7 F, Pulse: 90 bpm, Respiratory Rate: 18 breaths/min, Blood Pressure: 139/76 mmHg. Respiratory work of breathing is normal. Bilateral breath sounds are clear and equal in all lobes with no wheezes, rales or rhonchi.. Cardiovascular Heart rhythm and rate regular, without murmur or gallop.. Dorsalis pedis pulses are palpable bilaterally. Severe nonpitting edema left greater than right below the knees. Psychiatric appears at normal baseline. General Notes: Most of the problem is on the left posterior calf. Wide areas of denuded epithelium nonviable scaling callus and epidermis. This was vigorously wiped off with Anasept and gauze. She has a more superficial wound on the left anterior mid tibia area. Small areas on the right medial calf. The patient has severe bilateral left greater than right lymphedema Integumentary (Hair, Skin) Widespread rash that does look like a drug eruption as her neck and face morbidly obese nontender wound exam ooMost of the problem is on the left posterior  calf.. Wound #1 status is Open. Original cause of wound was Gradually Appeared. The wound is located on the Left,Medial Lower Leg. The wound measures 4cm length x 2.5cm width x 0.1cm depth; 7.854cm^2 area and 0.785cm^3 volume. There is Fat Layer (Subcutaneous Tissue) Exposed exposed. There is no tunneling or undermining noted. There is a medium amount of serosanguineous drainage noted. The wound margin is flat and intact. There is medium (34-66%) pink, pale granulation within the wound bed. There is a medium (34-66%) amount of necrotic tissue within the wound bed including Adherent Slough. Wound #2 status is Open. Original cause of wound was Gradually Appeared. The wound is located on the Left,Posterior Lower Leg. The wound  measures 21cm length x 24cm width x 0.1cm depth; 395.841cm^2 area and 39.584cm^3 volume. There is Fat Layer (Subcutaneous Tissue) Exposed exposed. There is no tunneling or undermining noted. There is a large amount of serosanguineous drainage noted. The wound margin is flat and intact. There is medium (34-66%) pink, pale granulation within the wound bed. There is a medium (34-66%) amount of necrotic tissue within the wound bed including Adherent Slough. Wound #3 status is Open. Original cause of wound was Gradually Appeared. The wound is located on the Right,Medial Lower Leg. The wound measures 2cm length x 0.4cm width x 0.1cm depth; 0.628cm^2 area and 0.063cm^3 volume. There is Fat Layer (Subcutaneous Tissue) Exposed exposed. There is no tunneling or undermining noted. There is a medium amount of serosanguineous drainage noted. The wound margin is flat and intact. There is medium (34-66%) pink, pale granulation within the wound bed. There is a medium (34-66%) amount of necrotic tissue within the wound bed including Adherent Slough. Wound #4 status is Open. Original cause of wound was Gradually Appeared. The wound is located on the Left Toe Great. The wound measures 1cm length x 0.6cm width x 0.1cm depth; 0.471cm^2 area and 0.047cm^3 volume. There is Fat Layer (Subcutaneous Tissue) Exposed exposed. There is no tunneling or undermining noted. There is a medium amount of serosanguineous drainage noted. The wound margin is fibrotic, thickened scar. There is large (67-100%) red granulation within the wound bed. There is a small (1-33%) amount of necrotic tissue within the wound bed including Adherent Slough. 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 There was a Four Layer Compression Therapy Procedure by Zandra Abts, RN. Post procedure Diagnosis Wound #1: Same as Pre-Procedure Wound #2 Pre-procedure diagnosis of Wound #2 is a Lymphedema located on the Left,Posterior Lower Leg . There was a Four Layer Compression Therapy Procedure by Zandra Abts, RN. Post procedure Diagnosis Wound #2: Same as Pre-Procedure Wound #3 Pre-procedure diagnosis of Wound #3 is a Lymphedema located on the Right,Medial Lower Leg . There was a Four Layer Compression Therapy Procedure by Zandra Abts, RN. Post procedure Diagnosis Wound #3: Same as Pre-Procedure Plan Follow-up Appointments: Return Appointment in 1 week. Dressing Change Frequency: Do not change entire dressing for one week. - all leg wounds Wound #4 Left Toe Great: Change dressing every day. Skin Barriers/Peri-Wound Care: Moisturizing lotion - both legs mixed with TCA TCA Cream or Ointment - both legs liberally mixed with lotion Wound Cleansing: May shower with protection. - may use cast protector Primary Wound Dressing: Wound #1 Left,Medial Lower Leg: Calcium Alginate with  Silver Wound #2 Left,Posterior Lower Leg: Calcium Alginate with Silver Wound #3 Right,Medial Lower Leg: Calcium Alginate with Silver Wound #4 Left Toe Great: Other: - Polysporin Secondary Dressing: Wound #1 Left,Medial Lower Leg: ABD pad Zetuvit or Kerramax Wound #2 Left,Posterior Lower Leg: ABD pad Zetuvit or Kerramax Wound #3 Right,Medial Lower Leg: ABD pad Wound #4 Left Toe Great: Other: - Bandaid Edema Control: 4 layer compression - Bilateral - may use Unna boot if needed at top half of wrap to help prevent wrap from rolling down Avoid standing for long periods of time Elevate legs to the level of the heart or above for 30 minutes daily and/or when sitting, a frequency of: - frequently throughout the day Exercise regularly 1. Liberal TCA with moisturizing cream 2.  Silver alginate ABDs 4-layer compression on the wound areas bilaterally left greater than right 3. I asked the patient to get me pictures of what her legs look like in the past or at least her daughter's opinion of what they look like. 4. I asked her to keep her legs elevated. She works from home for [employer] and asked her to keep her legs elevated when she is at home as well. 5. Also advised a walking exercise program 6. Ultimately this patient is going to require stockings and probably external compression pumps 7. 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 Surprisingly she does not find this painful and yet I can find no evidence of sensory loss either vibration or light touch nothing to explain this. I have asked her to wash this off with soap and water apply topical antibiotics I spent 35 minutes on the review of this patient's record physical examination and preparation of this record Electronic Signature(s) Signed: 06/10/2019 5:08:08 PM By: Baltazar Najjar MD Signed: 06/10/2019 5:30:42 PM By: Zandra Abts RN, BSN Previous Signature: 06/09/2019 6:01:37 PM Version By: Baltazar Najjar MD Entered By: Zandra Abts on 06/09/2019 19:36:09 HxROS Details Date of Service: -------------------------------------------------------------------------------- Kathryn Mu 06/09/2019 2:45 PM [name] Patient Account Number: [id number] Medical Record Treating RN: Yevonne Pax [id number] Number: Other Clinician: Date of Birth/Sex: 12-21-1955 (63 y.o. Female) Treating Provider/Extender:Andersyn Fragoso, Casimiro Needle Primary Care Provider: PATIENT, NO Referring Provider: Weeks in Treatment: 0 Information Obtained From Patient Constitutional Symptoms (General Health) Complaints and Symptoms: Negative for: Fatigue; Fever; Chills; Marked Weight Change Eyes Complaints and Symptoms: Positive for: Glasses / Contacts Negative for: Dry Eyes; Vision Changes Medical  History: Negative for: Cataracts; Glaucoma Ear/Nose/Mouth/Throat Complaints and Symptoms: Negative for: Chronic sinus problems or rhinitis Medical History: Negative for: Chronic sinus problems/congestion; Middle ear problems Respiratory Complaints and Symptoms: Negative for: Chronic or frequent coughs; Shortness of Breath Medical History: Negative for: Aspiration; Asthma; Chronic Obstructive Pulmonary Disease (COPD); Pneumothorax; Sleep Apnea; Tuberculosis Cardiovascular Complaints and Symptoms: Negative for: Chest pain Medical History: Negative for: Angina; Arrhythmia; Congestive Heart Failure; Coronary Artery Disease; Deep Vein Thrombosis; Hypertension; Hypotension; Myocardial Infarction; Peripheral Arterial Disease; Peripheral Venous Disease; Phlebitis; Vasculitis Gastrointestinal Complaints and Symptoms: Negative for: Frequent diarrhea; Nausea; Vomiting Medical History: Negative for: Cirrhosis ; Colitis; Crohns; Hepatitis A; Hepatitis B; Hepatitis C Endocrine Complaints and Symptoms: Negative for: Heat/cold intolerance Medical History: Negative for: Type I Diabetes; Type II Diabetes Genitourinary Complaints and Symptoms: Negative for: Frequent urination Medical History: Negative for: End Stage Renal Disease Integumentary (Skin) Complaints and Symptoms: Positive for: Wounds Medical History: Negative for: History of Burn Musculoskeletal Complaints and Symptoms: Negative for: Muscle Pain; Muscle Weakness Medical History: Negative for: Gout; Rheumatoid Arthritis; Osteoarthritis;  Osteomyelitis Neurologic Complaints and Symptoms: Negative for: Numbness/parasthesias Medical History: Negative for: Dementia; Neuropathy; Quadriplegia; Paraplegia; Seizure Disorder Psychiatric Complaints and Symptoms: Negative for: Claustrophobia; Suicidal Medical History: Negative for: Anorexia/bulimia; Confinement Anxiety Hematologic/Lymphatic Medical History: Positive for:  Lymphedema Negative for: Anemia; Hemophilia; Human Immunodeficiency Virus; Sickle Cell Disease Immunological Medical History: Negative for: Lupus Erythematosus; Raynauds; Scleroderma Oncologic Medical History: Negative for: Received Chemotherapy; Received Radiation Immunizations Pneumococcal Vaccine: Received Pneumococcal Vaccination: No Implantable Devices None Family and Social History Cancer: Yes - Father,Siblings; Diabetes: No; Heart Disease: No; Hereditary Spherocytosis: No; Hypertension: No; Kidney Disease: No; Lung Disease: Yes - Father; Seizures: No; Stroke: Yes - Maternal Grandparents; Thyroid Problems: No; Tuberculosis: No; Never smoker; Marital Status - Divorced; Alcohol Use: Never; Drug Use: No History; Caffeine Use: Daily; Financial Concerns: No; Food, Clothing or Shelter Needs: No; Support System Lacking: No; Transportation Concerns: No Electronic Signature(s) Signed: 06/09/2019 5:29:28 PM By: Yevonne Pax RN Signed: 06/09/2019 6:01:37 PM By: Baltazar Najjar MD Entered By: Yevonne Pax on 06/09/2019 15:19:13 -------------------------------------------------------------------------------- SuperBill Details Patient Name: Date of Service: Kathryn Gray 06/09/2019 Medical Record [id number] Patient Account Number: [id number] Date of Birth/Sex: Treating RN: Aug 20, 1955 (63 y.o. 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 Modifier Quantity: 1 Physician  Procedures CPT4 Code Description: [id number] WC PHYS LEVEL 3 NEW PT ICD-10 Diagnosis Description L97.221 Non-pressure chronic ulcer of left calf limited to bre L97.211 Non-pressure chronic ulcer of right calf limited to br I89.0 Lymphedema, not elsewhere classified Modifier: akdown of skin eakdown of skin Quantity: 1 Electronic Signature(s) Signed: 06/10/2019 5:08:08 PM By: Baltazar Najjar MD Signed: 06/10/2019 5:30:42 PM By: Zandra Abts RN, BSN Previous Signature: 06/09/2019 6:01:37 PM Version By: Baltazar Najjar MD Entered By: Zandra Abts on 06/09/2019 19:36:24

## 2019-06-10 NOTE — Progress Notes (Signed)
JASELLE, PRYER ([id number]) Visit Report for 06/09/2019 Allergy List Details Patient Name: Date of Service: Kathryn Schmitt, Kathryn Schmitt 06/09/2019 2:45 PM Medical Record [id number] Patient Account Number: [id number] Date of Birth/Sex: Treating RN: 05-Feb-1956 (64 y.o. Female) Carlene Coria Primary Care Meghanne Pletz: PATIENT, NO Other Clinician: Referring Claretta Kendra: Treating Jerlyn Pain/Extender:Robson, Esperanza Richters in Treatment: 0 Allergies Active Allergies clindamycin Allergy Notes Electronic Signature(s) Signed: 06/09/2019 5:29:28 PM By: Carlene Coria RN Entered By: Carlene Coria on 06/09/2019 15:15:58 -------------------------------------------------------------------------------- Arrival Information Details Patient Name: Date of Service: Kathryn Schmitt 06/09/2019 2:45 PM Medical Record [id number] Patient Account Number: [id number] Date of Birth/Sex: Treating RN: Oct 28, 1955 (64 y.o. Female) Carlene Coria Primary Care Jacklyne Baik: PATIENT, NO Other Clinician: Referring Karmelo Bass: Treating Chynah Orihuela/Extender:Robson, Esperanza Richters in Treatment: 0 Visit Information Patient Arrived: Ambulatory Arrival Time: 15:12 Accompanied By: self Transfer Assistance: None Patient Identification Verified: Yes Secondary Verification Process Yes Completed: Patient Requires Transmission-Based No Precautions: Patient Has Alerts: Yes Patient Alerts: L. ABI unable to obtain size of leg Electronic Signature(s) Signed: 06/10/2019 5:30:42 PM By: Levan Hurst RN, BSN Entered By: Levan Hurst on 06/09/2019 16:07:28 -------------------------------------------------------------------------------- Clinic Level of Care Assessment Details Patient Name: Date of Service: Kathryn Schmitt 06/09/2019 2:45 PM Medical Record [id number] Patient Account Number: [id number] Date of Birth/Sex: Treating RN: 08/07/1955 (63 y.o. Female) Levan Hurst Primary Care Ricke Kimoto: PATIENT,  NO Other Clinician: Referring Lili Harts: Treating Sierrah Luevano/Extender:Robson, Esperanza Richters in Treatment: 0 Clinic Level of Care Assessment Items TOOL 1 Quantity Score X - Use when EandM and Procedure is performed on INITIAL visit 1 0 ASSESSMENTS - Nursing Assessment / Reassessment X - General Physical Exam (combine w/ comprehensive assessment (listed just below) 1 20 when performed on new pt. evals) X - Comprehensive Assessment (HX, ROS, Risk Assessments, Wounds Hx, etc.) 1 25 ASSESSMENTS - Wound and Skin Assessment / Reassessment []  - Dermatologic / Skin Assessment (not related to wound area) 0 ASSESSMENTS - Ostomy and/or Continence Assessment and Care []  - Incontinence Assessment and Management 0 []  - Ostomy Care Assessment and Management (repouching, etc.) 0 PROCESS - Coordination of Care X - Simple Patient / Family Education for ongoing care 1 15 []  - Complex (extensive) Patient / Family Education for ongoing care 0 X - Staff obtains Programmer, systems, Records, Test Results / Process Orders 1 10 []  - Staff telephones HHA, Nursing Homes / Clarify orders / etc 0 []  - Routine Transfer to another Facility (non-emergent condition) 0 []  - Routine Hospital Admission (non-emergent condition) 0 X - New Admissions / Biomedical engineer / Ordering NPWT, Apligraf, etc. 1 15 []  - Emergency Hospital Admission (emergent condition) 0 PROCESS - Special Needs []  - Pediatric / Minor Patient Management 0 []  - Isolation Patient Management 0 []  - Hearing / Language / Visual special needs 0 []  - Assessment of Community assistance (transportation, D/C planning, etc.) 0 []  - Additional assistance / Altered mentation 0 []  - Support Surface(s) Assessment (bed, cushion, seat, etc.) 0 INTERVENTIONS - Miscellaneous []  - External ear exam 0 []  - Patient Transfer (multiple staff / Civil Service fast streamer / Similar devices) 0 []  - Simple Staple / Suture removal (25 or less) 0 []  - Complex Staple / Suture removal (26 or more)  0 []  - Hypo/Hyperglycemic Management (do not check if billed separately) 0 X - Ankle / Brachial Index (ABI) - do not check if billed separately 1 15 Has the patient been seen at the hospital within the last three years: Yes Total Score: 100 Level Of Care: New/Established - Level 3 Electronic Signature(s) Signed:  06/10/2019 5:30:42 PM By: Zandra Abts RN, BSN Entered By: Zandra Abts on 06/09/2019 19:35:30 -------------------------------------------------------------------------------- Compression Therapy Details Patient Name: Kathryn Schmitt Date of Service: 06/09/2019 2:45 PM Medical Record Number:[id number] Patient Account Number: [id number] Date of Birth/Sex: Treating RN: Jul 27, 1955 (64 y.o. Female) Zandra Abts Primary Care Kery Batzel: PATIENT, NO Other Clinician: Referring Dashonna Chagnon: Treating Dub Maclellan/Extender:Robson, Lamar Sprinkles in Treatment: 0 Compression Therapy Performed for Wound Wound #1 Left,Medial Lower Leg Assessment: Performed By: Clinician Zandra Abts, RN Compression Type: Four Layer Post Procedure Diagnosis Same as Pre-procedure Electronic Signature(s) Signed: 06/10/2019 5:30:42 PM By: Zandra Abts RN, BSN Entered By: Zandra Abts on 06/09/2019 16:16:23 -------------------------------------------------------------------------------- Compression Therapy Details Patient Name: Kathryn Schmitt Date of Service: 06/09/2019 2:45 PM Medical Record Number:[id number] Patient Account Number: [id number] Date of Birth/Sex: Treating RN: [birth year] (64 y.o. Female) Zandra Abts Primary Care Deasha Clendenin: PATIENT, NO Other Clinician: Referring Ho Parisi: Treating Fortune Torosian/Extender:Robson, Lamar Sprinkles in Treatment: 0 Compression Therapy Performed for Wound Wound #3 Right,Medial Lower Leg Assessment: Performed By: Clinician Zandra Abts, RN Compression Type: Four Layer Post Procedure Diagnosis Same as Pre-procedure Electronic Signature(s) Signed:  06/10/2019 5:30:42 PM By: Zandra Abts RN, BSN Entered By: Zandra Abts on 06/09/2019 16:16:23 -------------------------------------------------------------------------------- Compression Therapy Details Patient Name: Kathryn Schmitt Date of Service: 06/09/2019 2:45 PM Medical Record Number:[id number] Patient Account Number: [id number] Date of Birth/Sex: Treating RN: 12-Feb-1956 (64 y.o. Female) Zandra Abts Primary Care Maddex Garlitz: PATIENT, NO Other Clinician: Referring Mussa Groesbeck: Treating Kristain Filo/Extender:Robson, Lamar Sprinkles in Treatment: 0 Compression Therapy Performed for Wound Wound #2 Left,Posterior Lower Leg Assessment: Performed By: Clinician Zandra Abts, RN Compression Type: Four Layer Post Procedure Diagnosis Same as Pre-procedure Electronic Signature(s) Signed: 06/10/2019 5:30:42 PM By: Zandra Abts RN, BSN Entered By: Zandra Abts on 06/09/2019 16:16:23 -------------------------------------------------------------------------------- Encounter Discharge Information Details Patient Name: Date of Service: Kathryn Schmitt 06/09/2019 2:45 PM Medical Record [id number] Patient Account Number: [id number] Date of Birth/Sex: Treating RN: 09/10/1955 (64 y.o. Female) Shawn Stall Primary Care Raheem Kolbe: PATIENT, NO Other Clinician: Referring Merrell Borsuk: Treating Jadelynn Boylan/Extender:Robson, Lamar Sprinkles in Treatment: 0 Encounter Discharge Information Items Discharge Condition: Stable Ambulatory Status: Ambulatory Discharge Destination: Home Transportation: Private Auto Accompanied By: self Schedule Follow-up Appointment: Yes Clinical Summary of Care: Electronic Signature(s) Signed: 06/09/2019 5:47:02 PM By: Shawn Stall Entered By: Shawn Stall on 06/09/2019 17:46:31 -------------------------------------------------------------------------------- Lower Extremity Assessment Details Patient Name: Date of Service: Kathryn Schmitt, Kathryn Schmitt 06/09/2019  2:45 PM Medical Record [id number] Patient Account Number: [id number] Date of Birth/Sex: Treating RN: 02/07/1956 (64 y.o. Female) Epps, Lyla Son Primary Care Jaquelyne Firkus: PATIENT, NO Other Clinician: Referring Agripina Guyette: Treating Irlanda Croghan/Extender:Robson, Lamar Sprinkles in Treatment: 0 Edema Assessment Assessed: [Left: No] [Right: No] E[Left: dema] [Right: :] Calf Left: Right: Point of Measurement: 37 cm From Medial Instep 62 cm 64 cm Ankle Left: Right: Point of Measurement: 10 cm From Medial Instep 45 cm 35 cm Vascular Assessment Blood Pressure: Brachial: [Right:139] Ankle: [Right:Dorsalis Pedis: 130 0.94] Electronic Signature(s) Signed: 06/09/2019 5:29:28 PM By: Yevonne Pax RN Entered By: Yevonne Pax on 06/09/2019 15:34:28 -------------------------------------------------------------------------------- Multi Wound Chart Details Patient Name: Date of Service: Kathryn Schmitt 06/09/2019 2:45 PM Medical Record [id number] Patient Account Number: [id number] Date of Birth/Sex: Treating RN: 18-May-1955 (64 y.o. Female) Primary Care Brittinee Risk: PATIENT, NO Other Clinician: Referring Mylea Roarty: Treating Tillman Kazmierski/Extender:Robson, Lamar Sprinkles in Treatment: 0 Vital Signs Height(in): 60 Pulse(bpm): 90 Weight(lbs): 280 Blood Pressure(mmHg): 139/76 Body Mass Index(BMI): 55 Temperature(F): 98.7 Respiratory 18 Rate(breaths/min): Photos: [1:No Photos] [2:No Photos] [3:No Photos] Wound Location: [1:Left Lower Leg - Medial] [2:Left Lower Leg - Posterior] [3:Right Lower  Leg - Medial] Wounding Event: [1:Gradually Appeared] [2:Gradually Appeared] [3:Gradually Appeared] Primary Etiology: [1:Lymphedema] [2:Lymphedema] [3:Lymphedema] Comorbid History: [1:Lymphedema] [2:Lymphedema] [3:Lymphedema] Date Acquired: [1:05/01/2018] [2:05/02/2019] [3:05/01/2018] Weeks of Treatment: [1:0] [2:0] [3:0] Wound Status: [1:Open] [2:Open] [3:Open] Clustered Wound: [1:No] [2:No] [3:Yes] Measurements  L x W x D [1:4x2.5x0.1] [2:21x24x0.1] [3:2x0.4x0.1] (cm) Area (cm) : [1:7.854] [2:395.841] [3:0.628] Volume (cm) : [1:0.785] [2:39.584] [3:0.063] Classification: [1:Full Thickness Without Exposed Support Structures Exposed Support Structures Exposed Support Structures] [2:Full Thickness Without] [3:Full Thickness Without] Exudate Amount: [1:Medium] [2:Large] [3:Medium] Exudate Type: [1:Serosanguineous] [2:Serosanguineous] [3:Serosanguineous] Exudate Color: [1:red, brown] [2:red, brown] [3:red, brown] Wound Margin: [1:Flat and Intact] [2:Flat and Intact] [3:Flat and Intact] Granulation Amount: [1:Medium (34-66%)] [2:Medium (34-66%)] [3:Medium (34-66%)] Granulation Quality: [1:Pink, Pale] [2:Pink, Pale] [3:Pink, Pale] Necrotic Amount: [1:Medium (34-66%)] [2:Medium (34-66%)] [3:Medium (34-66%)] Exposed Structures: [1:Fat Layer (Subcutaneous Fat Layer (Subcutaneous Fat Layer (Subcutaneous Tissue) Exposed: Yes Fascia: No Tendon: No Muscle: No Joint: No Bone: No] [2:Tissue) Exposed: Yes Fascia: No Tendon: No Muscle: No Joint: No Bone: No] [3:Tissue) Exposed: Yes  Fascia: No Tendon: No Muscle: No Joint: No Bone: No] Epithelialization: [1:None] [2:None] [3:None] Procedures Performed: [1:Compression Therapy 4] [2:Compression Therapy N/A] [3:Compression Therapy N/A] Photos: [1:No Photos] [2:N/A] [3:N/A] Wound Location: [1:Left Toe Great] [2:N/A] [3:N/A] Wounding Event: [1:Gradually Appeared] [2:N/A] [3:N/A] Primary Etiology: [1:Inflammatory] [2:N/A] [3:N/A] Comorbid History: [1:Lymphedema] [2:N/A] [3:N/A] Date Acquired: [1:12/31/2018] [2:N/A] [3:N/A] Weeks of Treatment: [1:0] [2:N/A] [3:N/A] Wound Status: [1:Open] [2:N/A] [3:N/A] Clustered Wound: [1:No] [2:N/A] [3:N/A] Measurements L x W x D [1:1x0.6x0.1] [2:N/A] [3:N/A] (cm) Area (cm) : [1:0.471] [2:N/A] [3:N/A] Volume (cm) : [1:0.047] [2:N/A] [3:N/A] Classification: [1:Full Thickness Without Exposed Support Structures] [2:N/A]  [3:N/A] Exudate Amount: [1:Medium] [2:N/A] [3:N/A] Exudate Type: [1:Serosanguineous] [2:N/A] [3:N/A] Exudate Color: [1:red, brown] [2:N/A] [3:N/A] Wound Margin: [1:Fibrotic scar, thickened scarN/A] [3:N/A] Granulation Amount: [1:Large (67-100%)] [2:N/A] [3:N/A] Granulation Quality: [1:Red] [2:N/A] [3:N/A] Necrotic Amount: [1:Small (1-33%)] [2:N/A] [3:N/A] Exposed Structures: [1:Fat Layer (Subcutaneous N/A Tissue) Exposed: Yes Fascia: No Tendon: No Muscle: No Joint: No Bone: No] [3:N/A] Epithelialization: [1:None Compression Therapy] [2:N/A N/A] [3:N/A N/A] Treatment Notes Electronic Signature(s) Signed: 06/09/2019 6:01:37 PM By: Baltazar Najjar MD Entered By: Baltazar Najjar on 06/09/2019 16:26:16 -------------------------------------------------------------------------------- Multi-Disciplinary Care Plan Details Patient Name: Date of Service: Kathryn Schmitt 06/09/2019 2:45 PM Medical Record [id number] Patient Account Number: [id number] Date of Birth/Sex: Treating RN: 02/13/1956 (64 y.o. Female) Zandra Abts Primary Care Olin Gurski: PATIENT, NO Other Clinician: Referring Dalesha Stanback: Treating Verne Cove/Extender:Robson, Lamar Sprinkles in Treatment: 0 Active Inactive Venous Leg Ulcer Nursing Diagnoses: Actual venous Insuffiency (use after diagnosis is confirmed) Knowledge deficit related to disease process and management Goals: Patient will maintain optimal edema control Date Initiated: 06/09/2019 Target Resolution Date: 07/11/2019 Goal Status: Active Patient/caregiver will verbalize understanding of disease process and disease management Date Initiated: 06/09/2019 Target Resolution Date: 07/11/2019 Goal Status: Active Interventions: Assess peripheral edema status every visit. Compression as ordered Provide education on venous insufficiency Notes: Wound/Skin Impairment Nursing Diagnoses: Impaired tissue integrity Knowledge deficit related to ulceration/compromised skin  integrity Goals: Patient/caregiver will verbalize understanding of skin care regimen Date Initiated: 06/09/2019 Target Resolution Date: 07/11/2019 Goal Status: Active Interventions: Assess patient/caregiver ability to obtain necessary supplies Assess patient/caregiver ability to perform ulcer/skin care regimen upon admission and as needed Assess ulceration(s) every visit Provide education on ulcer and skin care Notes: Electronic Signature(s) Signed: 06/10/2019 5:30:42 PM By: Zandra Abts RN, BSN Entered By: Zandra Abts on 06/09/2019 16:12:09 -------------------------------------------------------------------------------- Pain Assessment Details Patient Name: Date of Service: Kathryn Schmitt 06/09/2019 2:45 PM Medical Record [id number]  Patient Account Number: [id number] Date of Birth/Sex: Treating RN: 07/29/1955 [age] y.o. Female) Yevonne PaxEpps, Carrie Primary Care Larsen Zettel: PATIENT, NO Other Clinician: Referring Dewey Neukam: Treating Hershy Flenner/Extender:Robson, Lamar SprinklesMichael Weeks in Treatment: 0 Active Problems Location of Pain Severity and Description of Pain Patient Has Paino No Site Locations Pain Management and Medication Current Pain Management: Electronic Signature(s) Signed: 06/09/2019 5:29:28 PM By: Yevonne PaxEpps, Carrie RN Entered By: Yevonne PaxEpps, Carrie on 06/09/2019 15:40:07 -------------------------------------------------------------------------------- Patient/Caregiver Education Details [name], Kathryn Schmitt 2/8/2021andnbsp2:45 Patient Name: Date of Service: H. PM Medical Record Patient Account Number: [id number] Number: Treating RN: Zandra AbtsLynch, Shatara Date of Birth/Gender: 06/12/1955 (63 y.o. Female) Other Clinician: Primary Care Treating PATIENT, NO Baltazar Najjarobson, Michael Physician: Physician/Extender: Referring Physician: Tania AdeWeeks in Treatment: 0 Education Assessment Education Provided To: Patient Education Topics Provided Venous: Methods: Explain/Verbal Responses: State  content correctly Wound/Skin Impairment: Methods: Explain/Verbal Responses: State content correctly Electronic Signature(s) Signed: 06/10/2019 5:30:42 PM By: Zandra AbtsLynch, Shatara RN, BSN Entered By: Zandra AbtsLynch, Shatara on 06/09/2019 16:12:25 -------------------------------------------------------------------------------- Wound Assessment Details Patient Name: Date of Service: Kathryn GrayWESTMORELAND, Kathryn H. 06/09/2019 2:45 PM Medical Record [id number] Patient Account Number: [id number] Date of Birth/Sex: Treating RN: 01/17/1956 (64 y.o. Female) Primary Care Eddrick Dilone: PATIENT, NO Other Clinician: Referring Danyell Awbrey: Treating Siena Poehler/Extender:Robson, Lamar SprinklesMichael Weeks in Treatment: 0 Wound Status Wound Number: 1 Primary Etiology: Lymphedema Wound Location: Left Lower Leg - Medial Wound Status: Open Wounding Event: Gradually Appeared Comorbid History: Lymphedema Date Acquired: 05/01/2018 Weeks Of Treatment: 0 Clustered Wound: No Photos Wound Measurements Length: (cm) 4 % Reduct Width: (cm) 2.5 % Reduct Depth: (cm) 0.1 Epitheli Area: (cm) 7.854 Tunneli Volume: (cm) 0.785 Undermi Wound Description Classification: Full Thickness Without Exposed Support Foul Od Structures Slough/ Wound Flat and Intact Margin: Exudate Exudate Medium Amount: Exudate Serosanguineous Type: Exudate red, brown Color: Wound Bed Granulation Amount: Medium (34-66%) Granulation Quality: Pink, Pale Fasci Necrotic Amount: Medium (34-66%) Fat L Necrotic Quality: Adherent Slough Tendo Muscl Joint Bone or After Cleansing: No Fibrino Yes Exposed Structure a Exposed: No ayer (Subcutaneous Tissue) Exposed: Yes n Exposed: No e Exposed: No Exposed: No Exposed: No ion in Area: 0% ion in Volume: 0% alization: None ng: No ning: No Treatment Notes Wound #1 (Left, Medial Lower Leg) 1. Cleanse With Wound Cleanser Soap and water 2. Periwound Care Moisturizing lotion TCA Cream 3. Primary Dressing  Applied Calcium Alginate Ag 4. Secondary Dressing ABD Pad Dry Gauze Kerramax/Xtrasorb 6. Support Layer Applied 4 layer compression wrap Notes unna boot upper portion of lower leg. netting. explained the orders, dressings, frequency of change, how to care for dressings, and when to return to wound center. patient in agreement. Electronic Signature(s) Signed: 06/10/2019 4:28:10 PM By: Benjaman KindlerJones, Dedrick EMT/HBOT Previous Signature: 06/09/2019 5:29:28 PM Version By: Yevonne PaxEpps, Carrie RN Entered By: Benjaman KindlerJones, Dedrick on 06/10/2019 11:43:25 -------------------------------------------------------------------------------- Wound Assessment Details Patient Name: Date of Service: Kathryn GrayWESTMORELAND, Kathryn H. 06/09/2019 2:45 PM Medical Record [id number] Patient Account Number: [id number] Date of Birth/Sex: Treating RN: 04/05/1956 (63 y.o. Female) Primary Care Nikolette Reindl: PATIENT, NO Other Clinician: Referring Kindra Bickham: Treating Avaleigh Decuir/Extender:Robson, Lamar SprinklesMichael Weeks in Treatment: 0 Wound Status Wound Number: 2 Primary Etiology: Lymphedema Wound Location: Left Lower Leg - Posterior Wound Status: Open Wounding Event: Gradually Appeared Comorbid History: Lymphedema Date Acquired: 05/02/2019 Weeks Of Treatment: 0 Clustered Wound: No Photos Wound Measurements Length: (cm) 21 % Reduct Width: (cm) 24 % Reduct Depth: (cm) 0.1 Epitheli Area: (cm) 395.841 Tunneli Volume: (cm) 39.584 Undermi Wound Description Classification: Full Thickness Without Exposed Support Foul Odo Structures Slough/F Wound Flat and Intact Margin: Exudate Large Amount: Exudate Serosanguineous Type: Exudate  red, brown Color: Wound Bed Granulation Amount: Medium (34-66%) Granulation Quality: Pink, Pale Fascia E Necrotic Amount: Medium (34-66%) Fat Laye Necrotic Quality: Adherent Slough Tendon E Muscle E Joint Ex Bone Exp r After Cleansing: No ibrino Yes Exposed Structure xposed: No r (Subcutaneous Tissue) Exposed:  Yes xposed: No xposed: No posed: No osed: No ion in Area: 0% ion in Volume: 0% alization: None ng: No ning: No Treatment Notes Wound #2 (Left, Posterior Lower Leg) 1. Cleanse With Wound Cleanser Soap and water 2. Periwound Care Moisturizing lotion TCA Cream 3. Primary Dressing Applied Calcium Alginate Ag 4. Secondary Dressing ABD Pad Dry Gauze Kerramax/Xtrasorb 6. Support Layer Applied 4 layer compression wrap Notes unna boot upper portion of lower leg. netting. explained the orders, dressings, frequency of change, how to care for dressings, and when to return to wound center. patient in agreement. Electronic Signature(s) Signed: 06/10/2019 4:28:10 PM By: Benjaman Kindler EMT/HBOT Previous Signature: 06/09/2019 5:29:28 PM Version By: Yevonne Pax RN Entered By: Benjaman Kindler on 06/10/2019 11:43:49 -------------------------------------------------------------------------------- Wound Assessment Details Patient Name: Date of Service: Kathryn Schmitt 06/09/2019 2:45 PM Medical Record [id number] Patient Account Number: [id number] Date of Birth/Sex: Treating RN: [birth year] (64 y.o. Female) Primary Care Thailyn Khalid: PATIENT, NO Other Clinician: Referring Visente Kirker: Treating Yanna Leaks/Extender:Robson, Lamar Sprinkles in Treatment: 0 Wound Status Wound Number: 3 Primary Etiology: Lymphedema Wound Location: Right Lower Leg - Medial Wound Status: Open Wounding Event: Gradually Appeared Comorbid History: Lymphedema Date Acquired: 05/01/2018 Weeks Of Treatment: 0 Clustered Wound: Yes Photos Wound Measurements Length: (cm) 2 Width: (cm) 0.4 Depth: (cm) 0.1 Area: (cm) 0.628 Volume: (cm) 0.063 Wound Description Classification: Full Thickness Without Exposed Support Structures Wound Flat and Intact Margin: Exudate Exudate Medium Amount: Exudate Serosanguineous Type: Exudate red, brown Color: Wound Bed Granulation Amount: Medium (34-66%) Granulation Quality:  Pink, Pale Necrotic Amount: Medium (34-66%) Necrotic Quality: Adherent Slough Foul Odor After Cleansing: No Slough/Fibrino Yes Exposed Structure Fascia Exposed: N Fat Layer (Subcutaneous Tissue) Exposed: Y Tendon Exposed: N Muscle Exposed: N Joint Exposed: N Bone Exposed: N % Reduction in Area: 0% % Reduction in Volume: 0% Epithelialization: None Tunneling: No Undermining: No o es o o o o Treatment Notes Wound #3 (Right, Medial Lower Leg) 1. Cleanse With Wound Cleanser Soap and water 2. Periwound Care Moisturizing lotion TCA Cream 3. Primary Dressing Applied Calcium Alginate Ag 4. Secondary Dressing ABD Pad Dry Gauze Kerramax/Xtrasorb 6. Support Layer Applied 4 layer compression wrap Notes unna boot upper portion of lower leg. netting. explained the orders, dressings, frequency of change, how to care for dressings, and when to return to wound center. patient in agreement. Electronic Signature(s) Signed: 06/10/2019 4:28:10 PM By: Benjaman Kindler EMT/HBOT Previous Signature: 06/09/2019 5:29:28 PM Version By: Yevonne Pax RN Entered By: Benjaman Kindler on 06/10/2019 11:42:51 -------------------------------------------------------------------------------- Wound Assessment Details Patient Name: Date of Service: Kathryn Schmitt 06/09/2019 2:45 PM Medical Record [id number] Patient Account Number: [id number] Date of Birth/Sex: Treating RN: 1955/10/02 (63 y.o. Female) Primary Care Abby Stines: PATIENT, NO Other Clinician: Referring Keahi Mccarney: Treating Advay Volante/Extender:Robson, Lamar Sprinkles in Treatment: 0 Wound Status Wound Number: 4 Primary Etiology: Inflammatory Wound Location: Left Toe Great Wound Status: Open Wounding Event: Gradually Appeared Comorbid History: Lymphedema Date Acquired: 12/31/2018 Weeks Of Treatment: 0 Clustered Wound: No Photos Wound Measurements Length: (cm) 1 % Reduct Width: (cm) 0.6 % Reduct Depth: (cm) 0.1 Epitheli Area: (cm)  0.471 Tunneli Volume: (cm) 0.047 Undermi Wound Description Classification: Full Thickness Without Exposed Support Foul Odo Structures Slough/F Wound Fibrotic scar, thickened scar Margin: Exudate  Medium Amount: Exudate Serosanguineous Type: Exudate red, brown Color: Wound Bed Granulation Amount: Large (67-100%) Granulation Quality: Red Fascia E Necrotic Amount: Small (1-33%) Fat Laye Necrotic Quality: Adherent Slough Tendon E Muscle E Joint Ex Bone Exp r After Cleansing: No ibrino Yes Exposed Structure xposed: No r (Subcutaneous Tissue) Exposed: Yes xposed: No xposed: No posed: No osed: No ion in Area: 0% ion in Volume: 0% alization: None ng: No ning: No Treatment Notes Wound #4 (Left Toe Great) 1. Cleanse With Wound Cleanser 3. Primary Dressing Applied Other primary dressing (specifiy in notes) 4. Secondary Dressing Other secondary dressing (specify in notes) Notes polysporin and bandaid applied. Electronic Signature(s) Signed: 06/10/2019 4:28:10 PM By: Benjaman Kindler EMT/HBOT Previous Signature: 06/09/2019 5:29:28 PM Version By: Yevonne Pax RN Entered By: Benjaman Kindler on 06/10/2019 11:44:14 -------------------------------------------------------------------------------- Vitals Details Patient Name: Date of Service: Kathryn Schmitt 06/09/2019 2:45 PM Medical Record [id number] Patient Account Number: [id number] Date of Birth/Sex: Treating RN: November 27, 1955 (63 y.o. Female) Epps, Carrie Primary Care Coralyn Roselli: PATIENT, NO Other Clinician: Referring Jahmir Salo: Treating Eyla Tallon/Extender:Robson, Lamar Sprinkles in Treatment: 0 Vital Signs Time Taken: 15:13 Temperature (F): 98.7 Height (in): 60 Pulse (bpm): 90 Source: Stated Respiratory Rate (breaths/min): 18 Weight (lbs): 280 Blood Pressure (mmHg): 139/76 Source: Stated Reference Range: 80 - 120 mg / dl Body Mass Index (BMI): 54.7 Electronic Signature(s) Signed: 06/09/2019 5:29:28 PM By:  Yevonne Pax RN Entered By: Yevonne Pax on 06/09/2019 15:14:16

## 2019-06-16 ENCOUNTER — Encounter (HOSPITAL_BASED_OUTPATIENT_CLINIC_OR_DEPARTMENT_OTHER): Payer: 59 | Admitting: Internal Medicine

## 2019-06-17 ENCOUNTER — Encounter (HOSPITAL_BASED_OUTPATIENT_CLINIC_OR_DEPARTMENT_OTHER): Payer: 59 | Admitting: Internal Medicine

## 2019-06-17 ENCOUNTER — Other Ambulatory Visit: Payer: Self-pay

## 2019-06-17 DIAGNOSIS — L97211 Non-pressure chronic ulcer of right calf limited to breakdown of skin: Secondary | ICD-10-CM | POA: Diagnosis not present

## 2019-06-18 NOTE — Progress Notes (Signed)
Kathryn Schmitt, Kathryn Schmitt ([id number]) Visit Report for 06/17/2019 Arrival Information Details Patient Name: Date of Service: REATHA, SUR 06/17/2019 3:30 PM Medical Record Number:[id number] Patient Account Number: [id number] Date of Birth/Sex: Treating RN: [birth year] (64 y.o. Tommye Standard Primary Care Lamyiah Crawshaw: PATIENT, NO Other Clinician: Referring Patryce Depriest: Treating Carlitos Bottino/Extender:Robson, Lamar Sprinkles in Treatment: 1 Visit Information History Since Last Visit Added or deleted any medications: No Patient Arrived: Ambulatory Any new allergies or adverse reactions: No Arrival Time: 15:26 Had a fall or experienced change in No Accompanied By: self activities of daily living that may affect Transfer Assistance: None risk of falls: Patient Identification Verified: Yes Signs or symptoms of abuse/neglect since last No Secondary Verification Process Yes visito Completed: Hospitalized since last visit: No Patient Requires Transmission-Based No Implantable device outside of the clinic excluding No Precautions: cellular tissue based products placed in the center Patient Has Alerts: Yes since last visit: Patient Alerts: L. ABI unable to Has Dressing in Place as Prescribed: Yes obtain Has Compression in Place as Prescribed: Yes size of leg Pain Present Now: No Electronic Signature(s) Signed: 06/17/2019 5:24:43 PM By: Zenaida Deed RN, BSN Entered By: Zenaida Deed on 06/17/2019 15:28:16 -------------------------------------------------------------------------------- Compression Therapy Details Patient Name: Kathryn Schmitt Date of Service: 06/17/2019 3:30 PM Medical Record Number:[id number] Patient Account Number: [id number] Date of Birth/Sex: Treating RN: 1955-07-20 (64 y.o. Freddy Finner Primary Care Denard Tuminello: PATIENT, NO Other Clinician: Referring Jakita Dutkiewicz: Treating Chantilly Linskey/Extender:Robson, Lamar Sprinkles in Treatment: 1 Compression Therapy  Performed for Wound Wound #2 Left,Posterior Lower Leg Assessment: Performed By: Clinician Yevonne Pax, RN Compression Type: Four Layer Post Procedure Diagnosis Same as Pre-procedure Electronic Signature(s) Signed: 06/18/2019 5:45:19 PM By: Yevonne Pax RN Entered By: Yevonne Pax on 06/17/2019 16:23:09 -------------------------------------------------------------------------------- Encounter Discharge Information Details Patient Name: Date of Service: Kathryn Schmitt 06/17/2019 3:30 PM Medical Record [id number] Patient Account Number: [id number] Date of Birth/Sex: Treating RN: 06/24/1955 (64 y.o. Harvest Dark Primary Care Tawnee Clegg: PATIENT, NO Other Clinician: Referring Srinika Delone: Treating Eduarda Scrivens/Extender:Robson, Lamar Sprinkles in Treatment: 1 Encounter Discharge Information Items Discharge Condition: Stable Ambulatory Status: Ambulatory Discharge Destination: Home Transportation: Private Auto Accompanied By: self Schedule Follow-up Appointment: Yes Clinical Summary of Care: Patient Declined Electronic Signature(s) Signed: 06/17/2019 5:22:24 PM By: Cherylin Mylar Entered By: Cherylin Mylar on 06/17/2019 17:20:14 -------------------------------------------------------------------------------- Lower Extremity Assessment Details Patient Name: Date of Service: Kathryn Schmitt, Kathryn Schmitt 06/17/2019 3:30 PM Medical Record [id number] Patient Account Number: [id number] Date of Birth/Sex: Treating RN: [birth year] (63 y.o. Tommye Standard Primary Care Octavion Mollenkopf: PATIENT, NO Other Clinician: Referring Marvelene Stoneberg: Treating Shaunessy Dobratz/Extender:Robson, Lamar Sprinkles in Treatment: 1 Edema Assessment Assessed: [Left: No] [Right: No] Edema: [Left: Yes] [Right: Yes] Calf Left: Right: Point of Measurement: 37 cm From Medial Instep 48.5 cm 52 cm Ankle Left: Right: Point of Measurement: 10 cm From Medial Instep 31.7 cm 34.7 cm Vascular  Assessment Pulses: Dorsalis Pedis Palpable: [Left:Yes] [Right:Yes] Electronic Signature(s) Signed: 06/17/2019 5:24:43 PM By: Zenaida Deed RN, BSN Entered By: Zenaida Deed on 06/17/2019 15:51:08 -------------------------------------------------------------------------------- Multi Wound Chart Details Patient Name: Date of Service: Kathryn Schmitt 06/17/2019 3:30 PM Medical Record [id number] Patient Account Number: [id number] Date of Birth/Sex: Treating RN: 1955-12-26 (64 y.o. Freddy Finner Primary Care Ryeleigh Santore: PATIENT, NO Other Clinician: Referring Jakala Herford: Treating Danell Verno/Extender:Robson, Lamar Sprinkles in Treatment: 1 Vital Signs Height(in): 60 Pulse(bpm): 85 Weight(lbs): 280 Blood Pressure(mmHg): 142/66 Body Mass Index(BMI): 55 Temperature(F): 98.3 Respiratory 20 Rate(breaths/min): Photos: [1:No Photos] [2:No Photos] [3:No Photos] Wound Location: [1:Left, Medial Lower Leg] [2:Left, Posterior Lower Leg] [3:Right, Medial Lower  Leg] Wounding Event: [1:Gradually Appeared] [2:Gradually Appeared] [3:Gradually Appeared] Primary Etiology: [1:Lymphedema] [2:Lymphedema] [3:Lymphedema] Comorbid History: [1:Lymphedema] [2:Lymphedema] [3:Lymphedema] Date Acquired: [1:05/01/2018] [2:05/02/2019] [3:05/01/2018] Weeks of Treatment: [1:1] [2:1] [3:1] Wound Status: [1:Healed - Epithelialized] [2:Open] [3:Healed - Epithelialized] Clustered Wound: [1:No] [2:No] [3:Yes] Measurements L x W x D [1:0x0x0] [2:0.1x0.1x0.1] [3:0x0x0] (cm) Area (cm) : [1:0] [2:0.008] [3:0] Volume (cm) : [1:0] [2:0.001] [3:0] % Reduction in Area: [1:100.00%] [2:100.00%] [3:100.00%] % Reduction in Volume: [1:100.00%] [2:100.00%] [3:100.00%] Classification: [1:Full Thickness Without Exposed Support StructuresExposed Support StructuresExposed Support Structures] [2:Full Thickness Without] [3:Full Thickness Without] Exudate Amount: [1:Small] [2:Medium] [3:Medium] Exudate Type: [1:Serous]  [2:Serous] [3:Serous] Exudate Color: [1:amber] [2:amber] [3:amber] Wound Margin: [1:Flat and Intact] [2:Flat and Intact] [3:Flat and Intact] Granulation Amount: [1:None Present (0%)] [2:Small (1-33%)] [3:None Present (0%)] Granulation Quality: [1:N/A] [2:Red] [3:N/A] Necrotic Amount: [1:None Present (0%)] [2:None Present (0%)] [3:None Present (0%)] Exposed Structures: [1:Fascia: No Fat Layer (Subcutaneous Fat Layer (Subcutaneous Fat Layer (Subcutaneous Tissue) Exposed: No Tendon: No Muscle: No Joint: No Bone: No] [2:Fascia: No Tissue) Exposed: No Tendon: No Muscle: No Joint: No Bone: No] [3:Fascia: No Tissue) Exposed:  No Tendon: No Muscle: No Joint: No Bone: No] Epithelialization: [1:Large (67-100%)] [2:Large (67-100%)] [3:Large (67-100%)] Procedures Performed: [1:N/A 4] [2:Compression Therapy N/A] [3:N/A N/A] Photos: [1:No Photos] [2:N/A] [3:N/A] Wound Location: [1:Left Toe Great] [2:N/A] [3:N/A] Wounding Event: [1:Gradually Appeared] [2:N/A] [3:N/A] Primary Etiology: [1:Inflammatory] [2:N/A] [3:N/A] Comorbid History: [1:Lymphedema] [2:N/A] [3:N/A] Date Acquired: [1:12/31/2018] [2:N/A] [3:N/A] Weeks of Treatment: [1:1] [2:N/A] [3:N/A] Wound Status: [1:Open] [2:N/A] [3:N/A] Clustered Wound: [1:No] [2:N/A] [3:N/A] Measurements L x W x D [1:0.5x0.4x0.1] [2:N/A] [3:N/A] (cm) Area (cm) : [1:0.157] [2:N/A] [3:N/A] Volume (cm) : [1:0.016] [2:N/A] [3:N/A] % Reduction in Area: [1:66.70%] [2:N/A] [3:N/A] % Reduction in Volume: [1:66.00%] [2:N/A] [3:N/A] Classification: [1:Full Thickness Without Exposed Support Structures] [2:N/A] [3:N/A] Exudate Amount: [1:Small] [2:N/A] [3:N/A] Exudate Type: [1:Serosanguineous] [2:N/A] [3:N/A] Exudate Color: [1:red, brown] [2:N/A] [3:N/A] Wound Margin: [1:Fibrotic scar, thickened scarN/A] [3:N/A] Granulation Amount: [1:Medium (34-66%)] [2:N/A] [3:N/A] Granulation Quality: [1:Red] [2:N/A] [3:N/A] Necrotic Amount: [1:None Present (0%)] [2:N/A]  [3:N/A] Exposed Structures: [1:Fat Layer (Subcutaneous N/A Tissue) Exposed: Yes Fascia: No Tendon: No Muscle: No Joint: No Bone: No] [3:N/A] Epithelialization: [1:Medium (34-66%) N/A] [2:N/A N/A] [3:N/A N/A] Treatment Notes Electronic Signature(s) Signed: 06/17/2019 5:18:57 PM By: Baltazar Najjar MD Signed: 06/18/2019 5:45:19 PM By: Yevonne Pax RN Entered By: Baltazar Najjar on 06/17/2019 16:39:13 -------------------------------------------------------------------------------- Multi-Disciplinary Care Plan Details Patient Name: Date of Service: Kathryn Schmitt 06/17/2019 3:30 PM Medical Record [id number] Patient Account Number: [id number] Date of Birth/Sex: Treating RN: 1955-10-22 (64 y.o. Freddy Finner Primary Care Jadin Kagel: PATIENT, NO Other Clinician: Referring Xoe Hoe: Treating Alvin Rubano/Extender:Robson, Lamar Sprinkles in Treatment: 1 Active Inactive Venous Leg Ulcer Nursing Diagnoses: Actual venous Insuffiency (use after diagnosis is confirmed) Knowledge deficit related to disease process and management Goals: Patient will maintain optimal edema control Date Initiated: 06/09/2019 Target Resolution Date: 07/11/2019 Goal Status: Active Patient/caregiver will verbalize understanding of disease process and disease management Date Initiated: 06/09/2019 Target Resolution Date: 07/11/2019 Goal Status: Active Interventions: Assess peripheral edema status every visit. Compression as ordered Provide education on venous insufficiency Notes: Wound/Skin Impairment Nursing Diagnoses: Impaired tissue integrity Knowledge deficit related to ulceration/compromised skin integrity Goals: Patient/caregiver will verbalize understanding of skin care regimen Date Initiated: 06/09/2019 Target Resolution Date: 07/11/2019 Goal Status: Active Interventions: Assess patient/caregiver ability to obtain necessary supplies Assess patient/caregiver ability to perform ulcer/skin care regimen  upon admission and as needed Assess ulceration(s) every visit Provide education on ulcer and skin care  Notes: Electronic Signature(s) Signed: 06/18/2019 5:45:19 PM By: Yevonne Pax RN Entered By: Yevonne Pax on 06/17/2019 15:53:39 -------------------------------------------------------------------------------- Pain Assessment Details Patient Name: Date of Service: Kathryn Schmitt, Kathryn Schmitt 06/17/2019 3:30 PM Medical Record [id number] Patient Account Number: [id number] Date of Birth/Sex: Treating RN: 01/03/1956 (64 y.o. Tommye Standard Primary Care Mazi Brailsford: PATIENT, NO Other Clinician: Referring Axtyn Woehler: Treating Shavonna Corella/Extender:Robson, Lamar Sprinkles in Treatment: 1 Active Problems Location of Pain Severity and Description of Pain Patient Has Paino No Site Locations Rate the pain. Current Pain Level: 0 Pain Management and Medication Current Pain Management: Electronic Signature(s) Signed: 06/17/2019 5:24:43 PM By: Zenaida Deed RN, BSN Entered By: Zenaida Deed on 06/17/2019 15:31:47 -------------------------------------------------------------------------------- Patient/Caregiver Education Details [name] Date of Service: 2/16/2021andnbsp3:30 [name] PM Medical Record Patient Account Number: [id number] Number: [id number] Number: Treating RN: Yevonne Pax Date of Birth/Gender: 1956/03/25 (63 y.o. F) Other Clinician: Primary Care Treating PATIENT, NO Baltazar Najjar Physician: Physician/Extender: Referring Physician: Tania Ade in Treatment: 1 Education Assessment Education Provided To: Patient Education Topics Provided Wound/Skin Impairment: Methods: Explain/Verbal Responses: State content correctly Electronic Signature(s) Signed: 06/18/2019 5:45:19 PM By: Yevonne Pax RN Entered By: Yevonne Pax on 06/17/2019 15:53:53 -------------------------------------------------------------------------------- Wound Assessment  Details Patient Name: Date of Service: Kathryn Schmitt, Kathryn Schmitt 06/17/2019 3:30 PM Medical Record [id number] Patient Account Number: [id number] Date of Birth/Sex: Treating RN: 02-14-1956 (64 y.o. Freddy Finner Primary Care Donelle Baba: PATIENT, NO Other Clinician: Referring Kellie Murrill: Treating Josiah Wojtaszek/Extender:Robson, Lamar Sprinkles in Treatment: 1 Wound Status Wound Number: 1 Primary Etiology: Lymphedema Wound Location: Left Lower Leg - Medial Wound Status: Healed - Epithelialized Wounding Event: Gradually Appeared Comorbid History: Lymphedema Date Acquired: 05/01/2018 Weeks Of Treatment: 1 Clustered Wound: No Photos Wound Measurements Length: (cm) 0 % Reducti Width: (cm) 0 % Reducti Depth: (cm) 0 Epitheli Area: (cm) 0 Tunneli Volume: (cm) 0 Undermi Wound Description Full Thickness Without Exposed Support Foul Odo Classification: Structures Slough/F Wound Flat and Intact Margin: Exudate Small Amount: Exudate Serous Type: Exudate amber Color: Wound Bed Granulation Amount: None Present (0%) Necrotic Amount: None Present (0%) Fascia E Fat Laye Tendon E Muscle E Joint Ex Bone Exp r After Cleansing: No ibrino No Exposed Structure xposed: No r (Subcutaneous Tissue) Exposed: No xposed: No xposed: No posed: No osed: No on in Area: 100% on in Volume: 100% alization: Large (67-100%) ng: No ning: No Electronic Signature(s) Signed: 06/18/2019 4:27:12 PM By: Benjaman Kindler EMT/HBOT Signed: 06/18/2019 5:45:19 PM By: Yevonne Pax RN Entered By: Benjaman Kindler on 06/18/2019 14:34:16 -------------------------------------------------------------------------------- Wound Assessment Details Patient Name: Date of Service: Kathryn Schmitt 06/17/2019 3:30 PM Medical Record [id number] Patient Account Number: [id number] Date of Birth/Sex: Treating RN: [birth year] (64 y.o. Freddy Finner Primary Care Mckynzi Cammon: PATIENT, NO Other Clinician: Referring  Lyrick Lagrand: Treating Kanani Mowbray/Extender:Robson, Lamar Sprinkles in Treatment: 1 Wound Status Wound Number: 2 Primary Etiology: Lymphedema Wound Location: Left Lower Leg - Posterior Wound Status: Open Wounding Event: Gradually Appeared Comorbid History: Lymphedema Date Acquired: 05/02/2019 Weeks Of Treatment: 1 Clustered Wound: No Photos Wound Measurements Length: (cm) 0.1 % Reduct Width: (cm) 0.1 % Reduct Depth: (cm) 0.1 Epitheli Area: (cm) 0.008 Tunneli Volume: (cm) 0.001 Undermi Wound Description Full Thickness Without Exposed Support Foul Odo Classification: Structures Slough/F Wound Flat and Intact Margin: Exudate Medium Amount: Exudate Serous Type: Exudate amber Color: Wound Bed Granulation Amount: Small (1-33%) Granulation Quality: Red Fascia E Necrotic Amount: None Present (0%) Fat Laye Tendon E Muscle E Joint Ex Bone Exp r After Cleansing: No ibrino No Exposed Structure xposed: No r (Subcutaneous  Tissue) Exposed: No xposed: No xposed: No posed: No osed: No ion in Area: 100% ion in Volume: 100% alization: Large (67-100%) ng: No ning: No Treatment Notes Wound #2 (Left, Posterior Lower Leg) 1. Cleanse With Wound Cleanser Soap and water 2. Periwound Care TCA Cream 3. Primary Dressing Applied Calcium Alginate Ag 4. Secondary Dressing ABD Pad 6. Support Layer Applied 4 layer compression wrap Notes polysporin to toe Electronic Signature(s) Signed: 06/18/2019 4:27:12 PM By: Mikeal Hawthorne EMT/HBOT Signed: 06/18/2019 5:45:19 PM By: Carlene Coria RN Entered By: Mikeal Hawthorne on 06/18/2019 14:34:40 -------------------------------------------------------------------------------- Wound Assessment Details Patient Name: Date of Service: Kathryn Schmitt 06/17/2019 3:30 PM Medical Record [id number] Patient Account Number: [id number] Date of Birth/Sex: Treating RN: March 02, 1956 (64 y.o. Orvan Falconer Primary Care Harlen Danford: PATIENT, NO Other  Clinician: Referring Montrail Mehrer: Treating Coulson Wehner/Extender:Robson, Esperanza Richters in Treatment: 1 Wound Status Wound Number: 3 Primary Etiology: Lymphedema Wound Location: Right Lower Leg - Medial Wound Status: Healed - Epithelialized Wounding Event: Gradually Appeared Comorbid History: Lymphedema Date Acquired: 05/01/2018 Weeks Of Treatment: 1 Clustered Wound: Yes Photos Wound Measurements Length: (cm) 0 % Reduct Width: (cm) 0 % Reduct Depth: (cm) 0 Epitheli Area: (cm) 0 Tunneli Volume: (cm) 0 Undermi Wound Description Full Thickness Without Exposed Support Foul Odo Classification: Structures Slough/F Wound Flat and Intact Margin: Exudate Medium Amount: Exudate Serous Type: Exudate amber Color: Wound Bed Granulation Amount: None Present (0%) Necrotic Amount: None Present (0%) Fascia E Fat Laye Tendon E Muscle E Joint Ex Bone Exp r After Cleansing: No ibrino No Exposed Structure xposed: No r (Subcutaneous Tissue) Exposed: No xposed: No xposed: No posed: No osed: No ion in Area: 100% ion in Volume: 100% alization: Large (67-100%) ng: No ning: No Electronic Signature(s) Signed: 06/18/2019 4:27:12 PM By: Mikeal Hawthorne EMT/HBOT Signed: 06/18/2019 5:45:19 PM By: Carlene Coria RN Entered By: Mikeal Hawthorne on 06/18/2019 14:33:55 -------------------------------------------------------------------------------- Wound Assessment Details Patient Name: Date of Service: Kathryn Schmitt 06/17/2019 3:30 PM Medical Record [id number] Patient Account Number: [id number] Date of Birth/Sex: Treating RN: [birth year] (64 y.o. Orvan Falconer Primary Care Lucee Brissett: PATIENT, NO Other Clinician: Referring Demari Gales: Treating Adrieana Fennelly/Extender:Robson, Esperanza Richters in Treatment: 1 Wound Status Wound Number: 4 Primary Etiology: Inflammatory Wound Location: Left Toe Great Wound Status: Open Wounding Event: Gradually Appeared Comorbid History: Lymphedema Date  Acquired: 12/31/2018 Weeks Of Treatment: 1 Clustered Wound: No Photos Wound Measurements Length: (cm) 0.5 % Reduct Width: (cm) 0.4 % Reduct Depth: (cm) 0.1 Epitheli Area: (cm) 0.157 Tunneli Volume: (cm) 0.016 Undermi Wound Description Classification: Full Thickness Without Exposed Support Foul Odo Structures Slough/F Wound Fibrotic scar, thickened scar Margin: Exudate Small Amount: Exudate Serosanguineous Type: Exudate red, brown Color: Wound Bed Granulation Amount: Medium (34-66%) Granulation Quality: Red Fascia Expo Necrotic Amount: None Present (0%) Fat Layer ( Tendon Expo Muscle Expo Joint Expos Bone Expose r After Cleansing: No ibrino Yes Exposed Structure sed: No Subcutaneous Tissue) Exposed: Yes sed: No sed: No ed: No d: No ion in Area: 66.7% ion in Volume: 66% alization: Medium (34-66%) ng: No ning: No Treatment Notes Wound #4 (Left Toe Great) 1. Cleanse With Wound Cleanser Soap and water 2. Periwound Care TCA Cream 3. Primary Dressing Applied Calcium Alginate Ag 4. Secondary Dressing ABD Pad 6. Support Layer Applied 4 layer compression wrap Notes polysporin to toe Electronic Signature(s) Signed: 06/18/2019 4:27:12 PM By: Mikeal Hawthorne EMT/HBOT Signed: 06/18/2019 5:45:19 PM By: Carlene Coria RN Entered By: Mikeal Hawthorne on 06/18/2019 14:31:01 -------------------------------------------------------------------------------- Vitals Details Patient Name: Date of Service: Kathryn Schmitt  H. 06/17/2019 3:30 PM Medical Record [id number] Patient Account Number: [id number] Date of Birth/Sex: Treating RN: [birth year] (64 y.o. Tommye Standard Primary Care Vestal Markin: PATIENT, NO Other Clinician: Referring Colbert Curenton: Treating Nadja Lina/Extender:Robson, Lamar Sprinkles in Treatment: 1 Vital Signs Time Taken: 15:28 Temperature (F): 98.3 Height (in): 60 Pulse (bpm): 85 Source: Stated Respiratory Rate (breaths/min): 20 Weight (lbs):  280 Blood Pressure (mmHg): 142/66 Source: Stated Reference Range: 80 - 120 mg / dl Body Mass Index (BMI): 54.7 Electronic Signature(s) Signed: 06/17/2019 5:24:43 PM By: Zenaida Deed RN, BSN Entered By: Zenaida Deed on 06/17/2019 15:30:39

## 2019-06-18 NOTE — Progress Notes (Signed)
Kathryn Schmitt, Kathryn Schmitt ([id number]) Visit Report for 06/17/2019 HPI Details Kathryn Schmitt, Kathryn Schmitt 06/17/2019 3:30 Patient Name: Date of Service: H. PM Medical Record Patient Account Number: [id number] Number: Treating RN: Yevonne Pax Date of Birth/Sex: [birth year] (63 y.o. F) Other Clinician: Primary Care Provider: PATIENT, [name] Baltazar Najjar Referring Provider: Provider/Extender: Tania Ade in Treatment: 1 History of Present Illness HPI Description: ADMISSION 06/09/2019 This is a 64 year old woman who comes in for review of wounds on her bilateral lower extremities. The exact duration of this is unclear however she was in the ER on 05/23/2019 with left lower extremity weeping and references to the fact that she had several courses of antibiotics through telehealth. They gave her clindamycin. The clindamycin on a 7-day course she finished about 10 days ago and then 2 days later developed a diffuse rash. She was back in the ER on 06/02/2019 given triamcinolone for the rash and doxycycline but she has not taken this. Past medical history this patient has bilateral lymphedema left greater than right. She says this started a year ago although I suspect it was there a lot longer than that. She has anemia. She does not have a primary care physician and has not seen a doctor outside of the ER in more than 6 years. She is not a known diabetic ABIs in our clinic were 0.94 on the right we could not pain this on the left 2/16; this the patient we admitted to the clinic last week. She has severe bilateral left greater than right lymphedema chronic venous insufficiency. Cobblestoning of her skin on the left lateral leg which would suggest that the lymphedema has been present for a very long time. She was unable to provide any pictures of her legs. There is not a reason to believe that there is another cause of her lymphedema. She has not had pelvic surgery or radiation trauma etc. etc. We  put her in compression last week and put alginate on any wound. There are 3 wound areas on the left leg are mostly epithelialized today Electronic Signature(s) Signed: 06/17/2019 5:18:57 PM By: Baltazar Najjar MD Entered By: Baltazar Najjar on 06/17/2019 16:40:17 -------------------------------------------------------------------------------- Physical Exam Details Kathryn Schmitt, Kathryn Schmitt 06/17/2019 3:30 Patient Name: Date of Service: H. PM Medical Record Patient Account Number: [id number] Number: [id number] Number: Treating RN: Yevonne Pax Date of Birth/Sex: 1955/07/09 (63 y.o. F) Other Clinician: Primary Care Provider: PATIENT, [name] Baltazar Najjar Referring Provider: Provider/Extender: Tania Ade in Treatment: 1 Constitutional Sitting or standing Blood Pressure is within target range for patient.. Pulse regular and within target range for patient.Marland Kitchen Respirations regular, non-labored and within target range.. Temperature is normal and within the target range for the patient.Marland Kitchen Appears in no distress. Cardiovascular Referral pulses are palpable. Notes Wound exam; the patient has wound areas on the left posterior calf anterior and lateral calf and left anterior.. This is all on the left. All of this appears to be epithelializing. No open wound on the right. Skin vigorously washed off with Anasept and gauze on the left posterior calf removing scaling dried surface skin. Electronic Signature(s) Signed: 06/17/2019 5:18:57 PM By: Baltazar Najjar MD Entered By: Baltazar Najjar on 06/17/2019 16:42:01 -------------------------------------------------------------------------------- Physician Orders Details Kathryn Schmitt, Kathryn Schmitt 06/17/2019 3:30 Patient Name: Date of Service: H. PM Medical Record Patient Account Number: [id number] Number: Treating RN: Yevonne Pax Date of Birth/Sex: March 25, 1956 (63 y.o. 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 Dressing Change Frequency Wound #2 Left,Posterior Lower Leg Do not change entire dressing for one week. - all leg wounds Wound #4 Left Toe Great Change dressing every day. Skin Barriers/Peri-Wound Care Moisturizing lotion - both legs mixed with TCA TCA Cream or Ointment - both legs liberally mixed with lotion Wound Cleansing May shower with protection. - may use cast protector Primary Wound Dressing Wound #2 Left,Posterior Lower Leg Calcium Alginate with Silver Wound #4 Left Toe Great Other: - Polysporin Secondary Dressing Wound #2 Left,Posterior Lower Leg ABD pad Zetuvit or Kerramax Wound #4 Left Toe Great Other: - Bandaid Edema Control 4 layer compression - Bilateral - may use Unna boot if needed at top half of wrap to help prevent wrap from rolling down Avoid standing for long periods of time Elevate legs to the level of the heart or above for 30 minutes daily and/or when sitting, a frequency of: - frequently throughout the day Exercise regularly Services and Therapies Lymphedema Clinic - ref to out patient clinic for lyphedema bi lat lower legs - (ICD10 I89.0 - Lymphedema, not elsewhere classified) Electronic Signature(s) Signed: 06/17/2019 5:18:57 PM By: Linton Ham MD Signed: 06/18/2019 5:45:19 PM By: Carlene Coria RN Entered By: Carlene Coria on 06/17/2019 16:22:31 -------------------------------------------------------------------------------- Prescription 06/17/2019 Patient Name: Kathryn Schmitt Provider: Linton Ham MD Date of Birth:  09/25/1955 NPI#: [id number] Sex: F DEA#: [id number] Phone #: [phone] License #: [id number] Patient Address: [facility] [place] [address] [place], [place] [address] [place], [place] [address] [phone] Allergies clindamycin Provider's Orders Lymphedema Clinic - ICD10: I89.0 - ref to out patient clinic for lyphedema bi lat lower legs Signature(s): Date(s): Electronic Signature(s) Signed: 06/17/2019 5:18:57 PM By: Linton Ham MD Signed: 06/18/2019 5:45:19 PM By: Carlene Coria RN Entered By: Carlene Coria on 06/17/2019 16:22:31 --------------------------------------------------------------------------------  Problem List Details Kathryn Schmitt, Kathryn Schmitt 06/17/2019 3:30 Patient Name: Date of Service: H. PM Medical Record Patient Account Number: [id number] Number: Treating RN: Carlene Coria Date of Birth/Sex: [birth year] (63 y.o. F) Other Clinician: Primary Care Provider: PATIENT, [name] Linton Ham Referring Provider: Provider/Extender: Suella Grove in Treatment: 1 Active Problems ICD-10 Evaluated Encounter Code Description Active Date Today Diagnosis L97.221 Non-pressure chronic ulcer of left calf limited to 06/09/2019 No Yes breakdown of skin L97.211 Non-pressure chronic ulcer of right calf limited to 06/09/2019 No Yes breakdown of skin I89.0 Lymphedema, not elsewhere classified 06/09/2019 No Yes Inactive Problems Resolved Problems Electronic Signature(s) Signed: 06/17/2019 5:18:57 PM By: Linton Ham MD Entered By: Linton Ham on 06/17/2019 16:39:05 -------------------------------------------------------------------------------- Progress Note Details Kathryn Schmitt, Kathryn Schmitt 06/17/2019 3:30 Patient Name: Date of Service: H. PM Medical Record Patient Account Number: [id number] Number: Treating RN: Carlene Coria Date of Birth/Sex: 03-11-1956 (63 y.o. F) Other Clinician: Primary Care  Provider: PATIENT, [name] Linton Ham Referring Provider: Provider/Extender: Suella Grove in Treatment: 1 Subjective History of Present Illness (HPI) ADMISSION 06/09/2019 This is a 63 year old woman who comes in for review of wounds on her bilateral lower extremities. The exact duration of this is unclear however she was in the ER on 05/23/2019 with left lower extremity weeping and references to the fact that she had several courses of antibiotics through telehealth. They gave her clindamycin. The clindamycin on a 7-day course she  finished about 10 days ago and then 2 days later developed a diffuse rash. She was back in the ER on 06/02/2019 given triamcinolone for the rash and doxycycline but she has not taken this. Past medical history this patient has bilateral lymphedema left greater than right. She says this started a year ago although I suspect it was there a lot longer than that. She has anemia. She does not have a primary care physician and has not seen a doctor outside of the ER in more than 6 years. She is not a known diabetic ABIs in our clinic were 0.94 on the right we could not pain this on the left 2/16; this the patient we admitted to the clinic last week. She has severe bilateral left greater than right lymphedema chronic venous insufficiency. Cobblestoning of her skin on the left lateral leg which would suggest that the lymphedema has been present for a very long time. She was unable to provide any pictures of her legs. There is not a reason to believe that there is another cause of her lymphedema. She has not had pelvic surgery or radiation trauma etc. etc. We put her in compression last week and put alginate on any wound. There are 3 wound areas on the left leg are mostly epithelialized today Objective Constitutional Sitting or standing Blood Pressure is within target range for patient.. Pulse regular and within target range for patient.Marland Kitchen Respirations regular, non-labored  and within target range.. Temperature is normal and within the target range for the patient.Marland Kitchen Appears in no distress. Vitals Time Taken: 3:28 PM, Height: 60 in, Source: Stated, Weight: 280 lbs, Source: Stated, BMI: 54.7, Temperature: 98.3 F, Pulse: 85 bpm, Respiratory Rate: 20 breaths/min, Blood Pressure: 142/66 mmHg. Cardiovascular Referral pulses are palpable. General Notes: Wound exam; the patient has wound areas on the left posterior calf anterior and lateral calf and left anterior.. This is all on the left. All of this appears to be epithelializing. No open wound on the right. Skin vigorously washed off with Anasept and gauze on the left posterior calf removing scaling dried surface skin. Integumentary (Hair, Skin) Wound #1 status is Healed - Epithelialized. Original cause of wound was Gradually Appeared. The wound is located on the Left,Medial Lower Leg. The wound measures 0cm length x 0cm width x 0cm depth; 0cm^2 area and 0cm^3 volume. There is no tunneling or undermining noted. There is a small amount of serous drainage noted. The wound margin is flat and intact. There is no granulation within the wound bed. There is no necrotic tissue within the wound bed. Wound #2 status is Open. Original cause of wound was Gradually Appeared. The wound is located on the Left,Posterior Lower Leg. The wound measures 0.1cm length x 0.1cm width x 0.1cm depth; 0.008cm^2 area and 0.001cm^3 volume. There is no tunneling or undermining noted. There is a medium amount of serous drainage noted. The wound margin is flat and intact. There is small (1-33%) red granulation within the wound bed. There is no necrotic tissue within the wound bed. Wound #3 status is Healed - Epithelialized. Original cause of wound was Gradually Appeared. The wound is located on the Right,Medial Lower Leg. The wound measures 0cm length x 0cm width x 0cm depth; 0cm^2 area and 0cm^3 volume. There is no tunneling or undermining noted.  There is a medium amount of serous drainage noted. The wound margin is flat and intact. There is no granulation within the wound bed. There is no necrotic tissue within the wound  bed. Wound #4 status is Open. Original cause of wound was Gradually Appeared. The wound is located on the Left Toe Great. The wound measures 0.5cm length x 0.4cm width x 0.1cm depth; 0.157cm^2 area and 0.016cm^3 volume. There is Fat Layer (Subcutaneous Tissue) Exposed exposed. There is no tunneling or undermining noted. There is a small amount of serosanguineous drainage noted. The wound margin is fibrotic, thickened scar. There is medium (34-66%) red granulation within the wound bed. There is no necrotic tissue within the wound bed. 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 There was a Four Layer Compression Therapy Procedure by Yevonne Pax, RN. Post procedure Diagnosis Wound #2: Same as Pre-Procedure Plan Follow-up Appointments: Return Appointment in 1 week. Dressing Change Frequency: Wound #2 Left,Posterior Lower Leg: Do not change entire dressing for one week. - all leg wounds Wound #4 Left Toe Great: Change dressing every day. Skin Barriers/Peri-Wound Care: Moisturizing lotion - both legs mixed with TCA TCA Cream or Ointment - both legs liberally mixed with lotion Wound Cleansing: May shower with protection. - may use cast protector Primary Wound Dressing: Wound #2 Left,Posterior Lower Leg: Calcium Alginate with Silver Wound #4 Left Toe Great: Other: - Polysporin Secondary Dressing: Wound #2 Left,Posterior Lower Leg: ABD pad Zetuvit or Kerramax Wound #4 Left Toe Great: Other: - Bandaid Edema Control: 4 layer  compression - Bilateral - may use Unna boot if needed at top half of wrap to help prevent wrap from rolling down Avoid standing for long periods of time Elevate legs to the level of the heart or above for 30 minutes daily and/or when sitting, a frequency of: - frequently throughout the day Exercise regularly Services and Therapies ordered were: Lymphedema Clinic - ref to out patient clinic for lyphedema bi lat lower legs 1. We again applied liberal TCA moisturizing cream 2. Alginate on the vulnerable areas on the left although they are epithelialized 3. This patient is probably going to need to be seen in the lymphedema clinic. We will try to make these arrangements to plan for ongoing maintenance care Electronic Signature(s) Signed: 06/17/2019 5:18:57 PM By: Baltazar Najjar MD Entered By: Baltazar Najjar on 06/17/2019 16:42:58 -------------------------------------------------------------------------------- SuperBill Details Patient Name: Date of Service: Kathryn Schmitt 06/17/2019 Medical Record [id number] Patient Account Number: [id number] Date of Birth/Sex: Treating RN: 02-15-1956 (63 y.o. 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 Modifier Quantity: 1 Physician Procedures CPT4 Code Description: [id number] - WC PHYS LEVEL 3 - EST PT ICD-10 Diagnosis Description L97.221 Non-pressure chronic ulcer of left calf limited to bre L97.211 Non-pressure  chronic ulcer of right calf limited to br I89.0 Lymphedema, not elsewhere  classified Modifier: akdown of skin eakdown of skin Quantity: 1 Electronic Signature(s) Signed: 06/17/2019 5:18:57 PM By: Baltazar Najjar MD Entered By: Baltazar Najjar on 06/17/2019 16:43:19

## 2019-06-24 ENCOUNTER — Other Ambulatory Visit: Payer: Self-pay

## 2019-06-24 ENCOUNTER — Encounter (HOSPITAL_BASED_OUTPATIENT_CLINIC_OR_DEPARTMENT_OTHER): Payer: 59 | Admitting: Internal Medicine

## 2019-06-24 DIAGNOSIS — L97211 Non-pressure chronic ulcer of right calf limited to breakdown of skin: Secondary | ICD-10-CM | POA: Diagnosis not present

## 2019-06-24 NOTE — Progress Notes (Signed)
Kathryn Schmitt, Kathryn Schmitt ([id number]) Visit Report for 06/24/2019 HPI Details Kathryn, Schmitt 06/24/2019 3:30 Patient Name: Date of Service: H. PM Medical Record Patient Account Number: [id number] Number: Treating RN: Yevonne Pax Date of Birth/Sex: July 26, 1955 (64 y.o. F) Other Clinician: Primary Care Provider: PATIENT, [name] Kathryn Schmitt Referring Provider: Provider/Extender: Tania Ade in Treatment: 2 History of Present Illness HPI Description: ADMISSION 06/09/2019 This is a 64 year old woman who comes in for review of wounds on her bilateral lower extremities. The exact duration of this is unclear however she was in the ER on 05/23/2019 with left lower extremity weeping and references to the fact that she had several courses of antibiotics through telehealth. They gave her clindamycin. The clindamycin on a 7-day course she finished about 10 days ago and then 2 days later developed a diffuse rash. She was back in the ER on 06/02/2019 given triamcinolone for the rash and doxycycline but she has not taken this. Past medical history this patient has bilateral lymphedema left greater than right. She says this started a year ago although I suspect it was there a lot longer than that. She has anemia. She does not have a primary care physician and has not seen a doctor outside of the ER in more than 6 years. She is not a known diabetic ABIs in our clinic were 0.94 on the right we could not pain this on the left 2/16; this the patient we admitted to the clinic last week. She has severe bilateral left greater than right lymphedema chronic venous insufficiency. Cobblestoning of her skin on the left lateral leg which would suggest that the lymphedema has been present for a very long time. She was unable to provide any pictures of her legs. There is not a reason to believe that there is another cause of her lymphedema. She has not had pelvic surgery or radiation trauma etc. etc. We  put her in compression last week and put alginate on any wound. There are 3 wound areas on the left leg are mostly epithelialized today 2/23; the patient has severe bilateral lymphedema. Her wounds which are largely on the left medial lower leg have closed. We use silver alginate. Electronic Signature(s) Signed: 06/24/2019 6:00:34 PM By: Kathryn Najjar MD Entered By: Kathryn Schmitt on 06/24/2019 17:48:27 -------------------------------------------------------------------------------- Physical Exam Details GER, NICKS 06/24/2019 3:30 Patient Name: Date of Service: Patient Name: Date of Service: H. PM Medical Record Patient Account Number: [id number] Number: Treating RN: Yevonne Pax Date of Birth/Sex: [birth year] (64 y.o. F) Other Clinician: Primary Care Provider: PATIENT, [name] Kathryn Schmitt Referring Provider: Provider/Extender: Tania Ade in Treatment: 2 Constitutional Patient is hypertensive.. Pulse regular and within target range for patient.Marland Kitchen Respirations regular, non-labored and within target range.. Temperature is normal and within the target range for the patient.Marland Kitchen Appears in no distress. Notes Wound exam; the patient has a wound on the left posterior calf that is closed also with the lateral left and medial left calf. All of this is closed. She has decent edema control. Very dry skin and I have urged. She does not appear to have an arterial issue Electronic Signature(s) Signed: 06/24/2019 6:00:34 PM By: Kathryn Najjar MD Entered By: Kathryn Schmitt on 06/24/2019 17:49:18 -------------------------------------------------------------------------------- Physician Orders Details NEKESHA, FONT 06/24/2019 3:30 Patient Name: Date of Service: H. PM Medical Record Patient Account Number: [id number] Number: Treating RN: Yevonne Pax Date of Birth/Sex: 01/10/1956 (64 y.o. 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 I89.0 Lymphedema, not elsewhere classified Discharge From [facility] Services Discharge from [place] - wraps to be removed at lymphedema clinic on thursday Skin Barriers/Peri-Wound Care Moisturizing lotion - both legs mixed with TCA TCA Cream or Ointment - both legs liberally mixed with lotion Edema Control 4 layer compression - Bilateral - may use Unna boot if needed at top half of wrap to help prevent wrap from rolling down Electronic Signature(s) Signed: 06/24/2019 6:00:34 PM By: Linton Ham MD Signed: 06/24/2019 6:06:02 PM By: Carlene Coria RN Entered By: Carlene Coria on 06/24/2019 17:25:06 -------------------------------------------------------------------------------- Problem List Details Schmitt, Kathryn 06/24/2019 3:30 Patient Name: Date of Service: H. PM Medical Record Patient Account Number: [id number] Number: Treating RN: Carlene Coria Date of Birth/Sex: 09-24-1955 (64 y.o. F) Other Clinician: Primary Care Provider: PATIENT, [name] Linton Ham Referring Provider: Provider/Extender: Suella Grove in Treatment: 2 Active Problems ICD-10 Evaluated Encounter Code Description Active Date Today Diagnosis L97.221 Non-pressure chronic ulcer of left calf limited to 06/09/2019 No Yes breakdown of skin L97.211 Non-pressure chronic ulcer of right calf limited to 06/09/2019 No Yes breakdown of skin I89.0 Lymphedema, not elsewhere classified 06/09/2019 No Yes Inactive Problems Resolved Problems Electronic Signature(s) Signed: 06/24/2019 6:00:34 PM By: Linton Ham MD Entered By: Linton Ham on 06/24/2019  17:46:21 -------------------------------------------------------------------------------- Progress Note Details ANNALYNNE, Schmitt 06/24/2019 3:30 Patient Name: Date of Service: H. PM Medical Record Patient Account Number: [id number] Medical Record Patient Account Number: [id number] Number: Treating RN: Carlene Coria Date of Birth/Sex: 11-12-1955 (64 y.o. F) Other Clinician: Primary Care Provider: PATIENT, [name] Linton Ham Referring Provider: Provider/Extender: Suella Grove in Treatment: 2 Subjective History of Present Illness (HPI) ADMISSION 06/09/2019 This is a 64 year old woman who comes in for review of wounds on her bilateral lower extremities. The exact duration of this is unclear however she was in the ER on 05/23/2019 with left lower extremity weeping and references to the fact that she had several courses of antibiotics through telehealth. They gave her clindamycin. The clindamycin on a 7-day course she finished about 10 days ago and then 2 days later developed a diffuse rash. She was back in the ER on 06/02/2019 given triamcinolone for the rash and doxycycline but she has not taken this. Past medical history this patient has bilateral lymphedema left greater than right. She says this started a year ago although I suspect it was there a lot longer than that. She has anemia. She does not have a primary care physician and has not seen a doctor outside of the ER in more than 6 years. She is not a known diabetic ABIs in our clinic were 0.94 on the right we could not pain this on the left 2/16; this the patient we admitted to the clinic last week. She has severe bilateral left greater than right lymphedema chronic venous insufficiency. Cobblestoning of her skin on the left lateral leg which would suggest that the lymphedema has been present for a very long time. She was unable to provide any pictures of her legs. There is not a reason to believe that there is another  cause of her lymphedema. She has not had pelvic surgery or radiation trauma etc. etc. We put her in compression last week and put alginate on any wound. There are 3 wound areas on the left  leg are mostly epithelialized today 2/23; the patient has severe bilateral lymphedema. Her wounds which are largely on the left medial lower leg have closed. We use silver alginate. Objective Constitutional Patient is hypertensive.. Pulse regular and within target range for patient.Marland Kitchen Respirations regular, non-labored and within target range.. Temperature is normal and within the target range for the patient.Marland Kitchen Appears in no distress. Vitals Time Taken: 4:41 PM, Height: 60 in, Weight: 280 lbs, BMI: 54.7, Temperature: 98.2 F, Pulse: 73 bpm, Respiratory Rate: 20 breaths/min, Blood Pressure: 163/74 mmHg. General Notes: Wound exam; the patient has a wound on the left posterior calf that is closed also with the lateral left and medial left calf. All of this is closed. She has decent edema control. Very dry skin and I have urged. She does not appear to have an arterial issue Integumentary (Hair, Skin) Wound #2 status is Healed - Epithelialized. Original cause of wound was Gradually Appeared. The wound is located on the Left,Posterior Lower Leg. The wound measures 0cm length x 0cm width x 0cm depth; 0cm^2 area and 0cm^3 volume. There is no tunneling or undermining noted. There is a none present amount of drainage noted. The wound margin is flat and intact. There is no granulation within the wound bed. There is no necrotic tissue within the wound bed. Wound #4 status is Healed - Epithelialized. Original cause of wound was Gradually Appeared. The wound is located on the Left Toe Great. The wound measures 0cm length x 0cm width x 0cm depth; 0cm^2 area and 0cm^3 volume. There is no tunneling or undermining noted. There is a none present amount of drainage noted. The wound margin is fibrotic, thickened scar. There is no  granulation within the wound bed. There is no necrotic tissue within the wound bed. 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 We put her in bilateral 4-layer compression 2. She is seeing the lymphedema specialist on Thursday and she can be discharged from our clinic. 3. I have urged her to lubricate her skin frequently when she can Electronic Signature(s) Signed: 06/24/2019 6:00:34 PM By: Kathryn Najjar MD Entered By: Kathryn Schmitt on 06/24/2019 17:49:55 -------------------------------------------------------------------------------- SuperBill Details Patient Name: Date of Service: Dutch Gray 06/24/2019 Medical Record [id number] Patient Account Number: [id number] Date of Birth/Sex: Treating RN: 30-Sep-1955 (63 y.o. 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 I89.0 Lymphedema, not elsewhere classified Facility Procedures CPT4 Code: [id number] Description: 99214 - WOUND CARE VISIT-LEV 4 EST PT Modifier: Quantity: 1 Physician Procedures CPT4 Code Description: [id number] - WC PHYS LEVEL 2 -  EST PT ICD-10 Diagnosis Description L97.221 Non-pressure chronic ulcer of left calf limited to bre L97.211 Non-pressure chronic ulcer of right calf limited to br I89.0 Lymphedema, not elsewhere  classified Modifier: akdown of skin eakdown of skin Quantity: 1 Electronic Signature(s) Signed: 06/24/2019 6:00:34 PM By: Kathryn Najjar MD Entered By: Kathryn Schmitt on 06/24/2019 17:50:15

## 2019-06-26 ENCOUNTER — Other Ambulatory Visit: Payer: Self-pay

## 2019-06-26 ENCOUNTER — Ambulatory Visit: Payer: 59 | Attending: Internal Medicine

## 2019-06-26 DIAGNOSIS — I890 Lymphedema, not elsewhere classified: Secondary | ICD-10-CM

## 2019-06-26 NOTE — Therapy (Signed)
[facility] [address] [place], [place], [address] Phone: [phone]   Fax:  [phone]  Physical Therapy Evaluation  Patient Details  Name: Kathryn Schmitt MRN: [id number] Date of Birth: [birth year] Referring Provider (PT): Baltazar Najjar MD   Encounter Date: 06/26/2019  PT End of Session - 06/26/19 1634    Visit Number  1    Number of Visits  13    Date for PT Re-Evaluation  08/14/19    PT Start Time  1300    PT Stop Time  1355    PT Time Calculation (min)  55 min    Activity Tolerance  Patient tolerated treatment well    Behavior During Therapy  [facility] for tasks assessed/performed       No past medical history on file.  Past Surgical History:  Procedure Laterality Date  . APPENDECTOMY     2007    There were no vitals filed for this visit.   Subjective Assessment - 06/26/19 1303    Subjective  Pt reports that she has swelling in both of her legs. She states that the L LE has been leaking for about 1 year or more but started to get really bad early in 2020 and the RLE started to get back the last 2 weeks in December of 2020. She states that she had a teledoc visit and was told that she had dermatitis so took multiple rounds of antibiotics that were not helping her legs get any better. She states that since she started at the wound clinic on 06/08/18 she has had a significant improvement in edema in the LE. She currently does not have any compression for her legs at home or a pump. Pt was released from the wound clinic on Tuesday 06/24/19.    Pertinent History  No significant medical history.    Patient Stated Goals  I want to get to where I can begin walking again and getting exercise. I want to know what things I definitely need to do for my legs not to swell again.    Currently in Pain?  No/denies    Multiple Pain Sites  No         [facility] PT Assessment - 06/26/19 0001      Assessment   Medical Diagnosis   Bil LE lymphedema    Referring Provider (PT)  Baltazar Najjar MD    Prior Therapy  No      Precautions   Precautions  Other (comment)   Bil LE lymphedema     Balance Screen   Has the patient fallen in the past 6 months  No    Has the patient had a decrease in activity level because of a fear of falling?   No    Is the patient reluctant to leave their home because of a fear of falling?   No      Home Environment   Living Environment  Private residence    Living Arrangements  Alone    Type of Home  House    Home Access  Stairs to enter    Entrance Stairs-Number of Steps  2    Entrance Stairs-Rails  None    Home Layout  Two level    Alternate Level Stairs-Number of Steps  13    Alternate Level Stairs-Rails  Left    Additional Comments  Pt reports she is not going down the stairs at this time.       Prior  Function   Level of Independence  Independent    Vocation  Full time employment    Vocation Requirements  Pt has to sit for prolonged periods of time.     Leisure  reading, getting out with family      Cognition   Overall Cognitive Status  Within Functional Limits for tasks assessed        LYMPHEDEMA/ONCOLOGY QUESTIONNAIRE - 06/26/19 1331      Lymphedema Stage   Stage  STAGE 3 ELEPHANTIASIS      Lymphedema Assessments   Lymphedema Assessments  Lower extremities      Right Lower Extremity Lymphedema   20 cm Proximal to Suprapatella  81 cm    10 cm Proximal to Suprapatella  78 cm    At Midpatella/Popliteal Crease  61 cm    30 cm Proximal to Floor at Lateral Plantar Foot  54.5 cm    20 cm Proximal to Floor at Lateral Plantar Foot  47 1    10 cm Proximal to Floor at Lateral Malleoli  36 cm    5 cm Proximal to 1st MTP Joint  26.4 cm    Across MTP Joint  23 cm    Around Proximal Great Toe  8 cm      Left Lower Extremity Lymphedema   20 cm Proximal to Suprapatella  81 cm    10 cm Proximal to Suprapatella  74.9 cm    At Midpatella/Popliteal Crease  58 cm    30 cm  Proximal to Floor at Lateral Plantar Foot  52 cm    20 cm Proximal to Floor at Lateral Plantar Foot  47.9 cm    10 cm Proximal to Floor at Lateral Malleoli  43 cm    5 cm Proximal to 1st MTP Joint  26.8 cm    Across MTP Joint  24.5 cm    Around Proximal Great Toe  9 cm             Outpatient Rehab from 06/26/[facility]  Lymphedema Life Impact Scale Total Score  19.12 %      Objective measurements completed on examination: See above findings.      [facility] Adult PT Treatment/Exercise - 06/26/19 0001      Manual Therapy   Manual Therapy  Edema management    Edema Management  Pt was provided with TG soft to cover the area in her L lower leg were her wrap from the wound clinic had slid down to decrease increased fluid build up in this area.              PT Education - 06/26/19 1633    Education Details  Pt was educated on the anatomy and physiology of the lymphatic system. She was educated on CDT including skin care, exercise, MLD and compression wrapping. Discussed risk reduction precautions and the importance of elevation and deep breathing in order to improve fluid flow. Briefly discussed compression wraps with patient.    Person(s) Educated  Patient    Methods  Explanation    Comprehension  Verbalized understanding       PT Short Term Goals - 06/26/19 1640      PT SHORT TERM GOAL #1   Title  Pt will be independent with HEP and self MLD in order to demonstrate autonomy of care.    Baseline  Pt does not know how to perform self MLD or have an HEP    Time  3    Period  Weeks    Status  New    Target Date  07/24/19        PT Long Term Goals - 06/26/19 1640      PT LONG TERM GOAL #1   Title  Patient will have reduction of limb girth by 3 cm in BIl lower legs and thighs to demonstrate decreased fluid and effectiveness of treatment.    Baseline  see measurements.    Time  6    Period  Weeks    Status  New    Target Date   08/14/19      PT LONG TERM GOAL #2   Title  Patient to be properly fitted with compression garment to wear on daily basis.    Baseline  Pt does not have garment.    Time  6    Period  Weeks    Status  New    Target Date  08/14/19      PT LONG TERM GOAL #3   Title  Patient will be independent in prevention/self-care management principles including self-massage and long term management plan for lymphedema    Baseline  pt is unaware of how to manage lymphedema.    Time  6    Period  Weeks    Status  New    Target Date  08/14/19      PT LONG TERM GOAL #4   Title  Pt will demonstrate less than 10% on LLIS to demonstrate improvement in quality of life related to lymphedema.    Baseline  19%    Time  6    Period  Weeks    Status  New    Target Date  08/14/19             Plan - 06/26/19 1635    Clinical Impression Statement  Pt presents to physical therapy with BIL lower extremities wrapped by wound clinic. The wrap on the L lower leg had started to slide down and there was an increase in fluid above the wrap and below the knee. She demonstrates fluid in her BLE from foot to thigh with minimal fluid build up in her toes. She is beginning to deveop lobules on the L thigh but has not yet developed lobules on the R though her measurements are larger on the R than the L. Pt has been discharged by the wound clinic and demonstrates signs of phlebolymphedema that is improving since being wrapped at the wound clinic but needs to be address to the groin in order to improve fluid flow out of the BIL LE. Pt was educated on use of compression at the thighs/abdomen and was provided with information for purchasing compression shorts. She was provided with TG soft to compression the L lower leg in order to prevent futher edema over the wrap that was placed at the wound clinic. Pt will benefit from skilled physical therapy services in order to improve BIL LE edema and learn lymphedema management at home  in order to decrease risk for hospitalization related to immoblity and infection.    Personal Factors and Comorbidities  Comorbidity 1    Comorbidities  obesity    Stability/Clinical Decision Making  Stable/Uncomplicated    Clinical Decision Making  Low    Rehab Potential  Good    PT Frequency  2x / week    PT Duration  6 weeks    PT Treatment/Interventions  Therapeutic activities;Therapeutic exercise;Manual techniques;Patient/family education  PT Next Visit Plan  Begin compression wrap and MLD education    PT Home Exercise Plan  phlebolymphedema exercises/deep breathing    Consulted and Agree with Plan of Care  Patient       Patient will benefit from skilled therapeutic intervention in order to improve the following deficits and impairments:  Increased edema, Decreased mobility  Visit Diagnosis: Lymphedema, not elsewhere classified     Problem List There are no problems to display for this patient.   Claudia Desanctis 06/26/2019, 4:43 PM  [facility] [address] [place], [place], [address] Phone: [phone]   Fax:  [phone]  Name: Kathryn Schmitt MRN: [id number] Date of Birth: 05/04/1955

## 2019-06-26 NOTE — Patient Instructions (Signed)
Lymphatic Drainage massage for phlebolymphedema   1. Deep breath 5-7 times breathing in through your nose into your stomach hold for 2-3 seconds then breathe out 2. Find the soft spot of your thigh just below the crease of your hip and massage in soft half circles  3. Find the soft spot behind your knee and massage in soft half circles.   Elevate your legs whenever you think about it. You can do one at a time or both. Do exercises throughout the day as your think of them (atleast 3x/day) 1) Pump your ankle up/down 30x (if you can't do 30x work up to it) 2) [employer] knee and straighten it 30x (if you can't do 30x work up to it) 3) Bring your hip up/down 30x alternating (if you can't do 30x work up to it) 4) Follow with deep breathing and massaging behind the knee and in the soft spot of your thigh.

## 2019-07-01 ENCOUNTER — Ambulatory Visit: Payer: 59 | Attending: Internal Medicine

## 2019-07-01 ENCOUNTER — Other Ambulatory Visit: Payer: Self-pay

## 2019-07-01 DIAGNOSIS — I890 Lymphedema, not elsewhere classified: Secondary | ICD-10-CM | POA: Diagnosis present

## 2019-07-01 NOTE — Therapy (Signed)
[facility] [address] [place], [place], [address] Phone: [phone]   Fax:  [phone]  Physical Therapy Treatment  Patient Details  Name: Kathryn Schmitt MRN: [id number] Date of Birth: 08-24-1955 Referring Provider (PT): Baltazar Najjar MD   Encounter Date: 07/01/2019  PT End of Session - 07/01/19 1605    Visit Number  2    Number of Visits  13    Date for PT Re-Evaluation  08/14/19    PT Start Time  1603    PT Stop Time  1716    PT Time Calculation (min)  73 min    Activity Tolerance  Patient tolerated treatment well    Behavior During Therapy  [facility] for tasks assessed/performed       No past medical history on file.  Past Surgical History:  Procedure Laterality Date  . APPENDECTOMY     2007    There were no vitals filed for this visit.  Subjective Assessment - 07/01/19 1605    Subjective  Pt reports that she has been doing well. She has purchased some compression shorts that she is wearing.    Pertinent History  No significant medical history.    Patient Stated Goals  I want to get to where I can begin walking again and getting exercise. I want to know what things I definitely need to do for my legs not to swell again.    Currently in Pain?  No/denies    Multiple Pain Sites  No                  Outpatient [facility]  Lymphedema Life Impact Scale Total Score  19.12 %           [facility] Adult PT Treatment/Exercise - 07/01/19 0001      Manual Therapy   Manual Therapy  Manual Lymphatic Drainage (MLD);Compression Bandaging    Manual therapy comments  Pt bil lower legs were washed and rinsed with soap/warm water, dried and then thick lotion was applied. She continues with desquamation L>R at the lower legs and significant discoloration at the L lower leg that improves with elevation.     Manual Lymphatic Drainage (MLD)  In supine:  short neck, swimming in the terminus, BIl axillary and inguinal nodes, Bil inguino-axillary anastomosis, on Bil legs LLE first followed by RLE: medial to lateral thigh, lateral thigh, bottle neck of the knee, posterior knee, all surfaces of the lower leg, dorsum of the foot, re-worked all surfaces 3 times then anastomosis followed by deep abdominals    Compression Bandaging  TG soft as a base layer for foot to knee on the R and foot to just above the knee on the L 1 6 cm for roman sandal and anlke sole heel, 8 cm for ankle then spiral wrap up followed by 2 10 cm on the R and L then 1 12 cm for the knee/thigh on the L. Pt wore her compression shorts that were used to help secure the wrap on the L.              PT Education - 07/01/19 1715    Education Details  Pt was educated on reasons to remove wraps including pain, numbness, tingling, change in sensation, rubbing or increased edema in the toes that is not resolved by elevation, exercise or removing the top wrap.    Person(s) Educated  Patient    Methods  Explanation  Comprehension  Verbalized understanding       PT Short Term Goals - 06/26/19 1640      PT SHORT TERM GOAL #1   Title  Pt will be independent with HEP and self MLD in order to demonstrate autonomy of care.    Baseline  Pt does not know how to perform self MLD or have an HEP    Time  3    Period  Weeks    Status  New    Target Date  07/24/19        PT Long Term Goals - 06/26/19 1640      PT LONG TERM GOAL #1   Title  Patient will have reduction of limb girth by 3 cm in BIl lower legs and thighs to demonstrate decreased fluid and effectiveness of treatment.    Baseline  see measurements.    Time  6    Period  Weeks    Status  New    Target Date  08/14/19      PT LONG TERM GOAL #2   Title  Patient to be properly fitted with compression garment to wear on daily basis.    Baseline  Pt does not have garment.    Time  6    Period  Weeks    Status  New     Target Date  08/14/19      PT LONG TERM GOAL #3   Title  Patient will be independent in prevention/self-care management principles including self-massage and long term management plan for lymphedema    Baseline  pt is unaware of how to manage lymphedema.    Time  6    Period  Weeks    Status  New    Target Date  08/14/19      PT LONG TERM GOAL #4   Title  Pt will demonstrate less than 10% on LLIS to demonstrate improvement in quality of life related to lymphedema.    Baseline  19%    Time  6    Period  Weeks    Status  New    Target Date  08/14/19            Plan - 07/01/19 1605    Clinical Impression Statement  Pt presents to physical therapy with her wound wraps still in place. Bil LE were wrapped with layered compression bandages following skin care and MLD. Pt lower legs were wrapped in elevation and then the L leg was brought down for wrapping of the knee and thigh. Pt will benefit from continued POC at this time.    Personal Factors and Comorbidities  Comorbidity 1    Comorbidities  obesity    Rehab Potential  Good    PT Duration  6 weeks    PT Treatment/Interventions  Therapeutic activities;Therapeutic exercise;Manual techniques;Patient/family education    PT Next Visit Plan  Continue with MLD and provide education, continue with wrapping, assess last wrap    PT Home Exercise Plan  phlebolymphedema exercises/deep breathing    Consulted and Agree with Plan of Care  Patient       Patient will benefit from skilled therapeutic intervention in order to improve the following deficits and impairments:  Increased edema, Decreased mobility  Visit Diagnosis: Lymphedema, not elsewhere classified     Problem List There are no problems to display for this patient.   Claudia Desanctis, PT 07/01/2019, 5:23 PM  [facility] [address]  [place], [place], [address] Phone: [phone]   Fax:  [phone]  Name:  Kathryn Schmitt MRN: [id number] Date of Birth: [birth year]

## 2019-07-03 ENCOUNTER — Ambulatory Visit: Payer: 59

## 2019-07-03 ENCOUNTER — Other Ambulatory Visit: Payer: Self-pay

## 2019-07-03 DIAGNOSIS — I890 Lymphedema, not elsewhere classified: Secondary | ICD-10-CM | POA: Diagnosis not present

## 2019-07-03 NOTE — Therapy (Signed)
[facility] [address] [place], [place], [address] Phone: [phone]   Fax:  [phone]  Physical Therapy Treatment  Patient Details  Name: Kathryn Schmitt MRN: [id number] Date of Birth: [birth year] Referring Provider (PT): Baltazar Najjar MD   Encounter Date: 07/03/2019  PT End of Session - 07/03/19 1540    Visit Number  3    Number of Visits  13    Date for PT Re-Evaluation  08/14/19    PT Start Time  1539    PT Stop Time  1640    PT Time Calculation (min)  61 min    Activity Tolerance  Patient tolerated treatment well    Behavior During Therapy  [facility] for tasks assessed/performed       No past medical history on file.  Past Surgical History:  Procedure Laterality Date  . APPENDECTOMY     2007    There were no vitals filed for this visit.  Subjective Assessment - 07/03/19 1540    Subjective  Pt states she had no issues with the wraps. She states the L knee started trying to unravel but other then that they did fine. She took her wraps off about one hour before her appointment.    Pertinent History  No significant medical history.    Patient Stated Goals  I want to get to where I can begin walking again and getting exercise. I want to know what things I definitely need to do for my legs not to swell again.    Currently in Pain?  No/denies    Multiple Pain Sites  No                  Outpatient Rehab from 06/26/2019 [facility]  Lymphedema Life Impact Scale Total Score  19.12 %           [facility] Adult PT Treatment/Exercise - 07/03/19 0001      Manual Therapy   Manual Therapy  Manual Lymphatic Drainage (MLD);Compression Bandaging    Manual therapy comments  Pt bil lower legs were washed and rinsed with soap/warm water, dried and then thick lotion was applied. She continues with desquamation L>R at the lower legs and significant discoloration at the L lower  leg that improves with elevation.     Manual Lymphatic Drainage (MLD)  In supine: short neck, swimming in the terminus, BIl axillary and inguinal nodes, Bil inguino-axillary anastomosis, on Bil legs LLE first followed by RLE: medial to lateral thigh, lateral thigh, bottle neck of the knee, posterior knee, all surfaces of the lower leg, dorsum of the foot, re-worked all surfaces 3 times then anastomosis followed by deep abdominals    Compression Bandaging  TG soft as a base layer for foot to knee on the R and foot to just above the knee on the L 1 6 cm for roman sandal and anlke sole heel, 8 cm for ankle then spiral wrap up followed by 2 10 cm on the R and L then 2 12 cm for the knee/thigh on the L to help keep from unraveling. Pt wore her compression shorts that were used to help secure the wrap on the L.              PT Education - 07/03/19 1655    Education Details  Re-iterated education on reasons to remove the wrap. Discussed possible garments this session.    Person(s) Educated  Patient    Methods  Explanation    Comprehension  Verbalized understanding       PT Short Term Goals - 06/26/19 1640      PT SHORT TERM GOAL #1   Title  Pt will be independent with HEP and self MLD in order to demonstrate autonomy of care.    Baseline  Pt does not know how to perform self MLD or have an HEP    Time  3    Period  Weeks    Status  New    Target Date  07/24/19        PT Long Term Goals - 06/26/19 1640      PT LONG TERM GOAL #1   Title  Patient will have reduction of limb girth by 3 cm in BIl lower legs and thighs to demonstrate decreased fluid and effectiveness of treatment.    Baseline  see measurements.    Time  6    Period  Weeks    Status  New    Target Date  08/14/19      PT LONG TERM GOAL #2   Title  Patient to be properly fitted with compression garment to wear on daily basis.    Baseline  Pt does not have garment.    Time  6    Period  Weeks    Status  New    Target  Date  08/14/19      PT LONG TERM GOAL #3   Title  Patient will be independent in prevention/self-care management principles including self-massage and long term management plan for lymphedema    Baseline  pt is unaware of how to manage lymphedema.    Time  6    Period  Weeks    Status  New    Target Date  08/14/19      PT LONG TERM GOAL #4   Title  Pt will demonstrate less than 10% on LLIS to demonstrate improvement in quality of life related to lymphedema.    Baseline  19%    Time  6    Period  Weeks    Status  New    Target Date  08/14/19            Plan - 07/03/19 1539    Clinical Impression Statement  Pt presents to physical therapy with her compressoin wraps removed. She states that she removed them about 1 hour prior to her session. She has significant rubor in Bil LE. MLD was performed following skin care and prior to layered compression wrap. Pt lower legs were wrapped in dependent position this session to get slightly tighter wrap. 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 Pt will benefit from continued POC at this time.    Personal Factors and Comorbidities  Comorbidity 1    Comorbidities  obesity    Rehab Potential  Good    PT Frequency  2x / week    PT Duration  6 weeks    PT Treatment/Interventions  Therapeutic activities;Therapeutic exercise;Manual techniques;Patient/family education    PT Next Visit Plan  Continue with MLD and provide education, continue with wrapping, assess last wrap    PT Home Exercise Plan  phlebolymphedema exercises/deep breathing    Consulted and Agree with Plan of Care  Patient       Patient will benefit from skilled therapeutic intervention in order to improve the following deficits  and impairments:  Increased edema, Decreased mobility  Visit Diagnosis: Lymphedema, not elsewhere classified     Problem List There  are no problems to display for this patient.   Claudia Desanctis, PT 07/03/2019, 5:00 PM  [facility] [address] [place], [place], [address] Phone: [phone]   Fax:  [phone]  Name: Kathryn Schmitt MRN: [id number] Date of Birth: 04-20-1956

## 2019-07-08 ENCOUNTER — Ambulatory Visit: Payer: 59

## 2019-07-08 ENCOUNTER — Other Ambulatory Visit: Payer: Self-pay

## 2019-07-08 DIAGNOSIS — I890 Lymphedema, not elsewhere classified: Secondary | ICD-10-CM | POA: Diagnosis not present

## 2019-07-08 NOTE — Therapy (Signed)
[facility] [address] [place], [place], [address] Phone: [phone]   Fax:  [phone]  Physical Therapy Treatment  Patient Details  Name: Kathryn Schmitt MRN: [id number] Date of Birth: 1955/09/05 Referring Provider (PT): Baltazar Najjar MD   Encounter Date: 07/08/2019  PT End of Session - 07/08/19 1541    Visit Number  4    Number of Visits  13    Date for PT Re-Evaluation  08/14/19    PT Start Time  1545    PT Stop Time  1700    PT Time Calculation (min)  75 min    Activity Tolerance  Patient tolerated treatment well    Behavior During Therapy  [facility] for tasks assessed/performed       No past medical history on file.  Past Surgical History:  Procedure Laterality Date  . APPENDECTOMY     2007    There were no vitals filed for this visit.  Subjective Assessment - 07/08/19 1542    Pertinent History  No significant medical history.    Patient Stated Goals  I want to get to where I can begin walking again and getting exercise. I want to know what things I definitely need to do for my legs not to swell again.    Currently in Pain?  No/denies    Multiple Pain Sites  No                  Outpatient Rehab from 06/26/2019 [facility]  Lymphedema Life Impact Scale Total Score  19.12 %           [facility] Adult PT Treatment/Exercise - 07/08/19 0001      Manual Therapy   Manual Therapy  Manual Lymphatic Drainage (MLD);Compression Bandaging    Manual therapy comments  Pt bil lower legs were washed and rinsed with soap/warm water, dried and then thick lotion was applied. She continues with desquamation L>R at the lower legs and significant discoloration at the L lower leg that improves with elevation.     Manual Lymphatic Drainage (MLD)  In supine: short neck, swimming in the terminus, BIl axillary and inguinal nodes, Bil inguino-axillary anastomosis, on Bil legs LLE  first followed by RLE: medial to lateral thigh, lateral thigh, bottle neck of the knee, posterior knee, all surfaces of the lower leg, dorsum of the foot, re-worked all surfaces 3 times then anastomosis followed by deep abdominals    Compression Bandaging  TG soft as a base layer for foot to groin Bil. Pt was wrapped with short stretch bandages from foot to knee on the R and foot to groin on the L. 3 short stretch bandages were used on the RLE and 5 on the L to mid thigh.              PT Education - 07/08/19 1658    Education Details  Discussed compression garments with patient this session as last time including a variety of types of compression and varying mix/match that might be possible due to tender delicate skin on the lower legs and possible lipedema in the thighs.    Person(s) Educated  Patient    Methods  Explanation    Comprehension  Verbalized understanding       PT Short Term Goals - 06/26/19 1640      PT SHORT TERM GOAL #1   Title  Pt will be independent with HEP and self MLD in order to demonstrate  autonomy of care.    Baseline  Pt does not know how to perform self MLD or have an HEP    Time  3    Period  Weeks    Status  New    Target Date  07/24/19        PT Long Term Goals - 06/26/19 1640      PT LONG TERM GOAL #1   Title  Patient will have reduction of limb girth by 3 cm in BIl lower legs and thighs to demonstrate decreased fluid and effectiveness of treatment.    Baseline  see measurements.    Time  6    Period  Weeks    Status  New    Target Date  08/14/19      PT LONG TERM GOAL #2   Title  Patient to be properly fitted with compression garment to wear on daily basis.    Baseline  Pt does not have garment.    Time  6    Period  Weeks    Status  New    Target Date  08/14/19      PT LONG TERM GOAL #3   Title  Patient will be independent in prevention/self-care management principles including self-massage and long term management plan for lymphedema     Baseline  pt is unaware of how to manage lymphedema.    Time  6    Period  Weeks    Status  New    Target Date  08/14/19      PT LONG TERM GOAL #4   Title  Pt will demonstrate less than 10% on LLIS to demonstrate improvement in quality of life related to lymphedema.    Baseline  19%    Time  6    Period  Weeks    Status  New    Target Date  08/14/19            Plan - 07/08/19 1541    Clinical Impression Statement  Pt presents to physical therapy with her compression wrap partially removed from her LE. She continues with significant rubor in her L lower leg that improves with elevation. MLD was perfomred following skin care and prior to layered compression wrap. Pt lower legs were wrapped in dependent position seated and her L thigh was wrapped in standing. Extra long TG soft was used this session and folded at the top in order to faciltate fluid flow out of the BIl thighs. Continued conversation on possible types of compression due to mixed nature of pt lymphedema. Pt will benefit from continued POC at this time.    Personal Factors and Comorbidities  Comorbidity 1    Comorbidities  obesity    Rehab Potential  Good    PT Frequency  2x / week    PT Duration  6 weeks    PT Treatment/Interventions  Therapeutic activities;Therapeutic exercise;Manual techniques;Patient/family education    PT Next Visit Plan  Continue with MLD and provide education, continue with wrapping, assess last wrap    PT Home Exercise Plan  phlebolymphedema exercises/deep breathing    Consulted and Agree with Plan of Care  Patient       Patient will benefit from skilled therapeutic intervention in order to improve the following deficits and impairments:  Increased edema, Decreased mobility  Visit Diagnosis: Lymphedema, not elsewhere classified     Problem List There are no problems to display for this patient.   Ander Purpura, PT  07/08/2019, 5:03 PM  [facility] [address] [place], [place], [address] Phone: [phone]   Fax:  [phone]  Name: Kathryn Schmitt MRN: [id number] Date of Birth: 1955/12/02

## 2019-07-10 ENCOUNTER — Ambulatory Visit: Payer: 59

## 2019-07-10 ENCOUNTER — Other Ambulatory Visit: Payer: Self-pay

## 2019-07-10 DIAGNOSIS — I890 Lymphedema, not elsewhere classified: Secondary | ICD-10-CM | POA: Diagnosis not present

## 2019-07-10 NOTE — Therapy (Signed)
[place], [place], [address] Phone: [phone]   Fax:  [phone]  Physical Therapy Treatment  Patient Details  Name: Kathryn Schmitt MRN: [id number] Date of Birth: 1956-01-12 Referring Provider (PT): Linton Ham MD   Encounter Date: 07/10/2019  PT End of Session - 07/10/19 1558    Visit Number  5    Number of Visits  13    Date for PT Re-Evaluation  08/14/19    PT Start Time  0258    PT Stop Time  1700    PT Time Calculation (min)  62 min    Activity Tolerance  Patient tolerated treatment well    Behavior During Therapy  [facility] for tasks assessed/performed       No past medical history on file.  Past Surgical History:  Procedure Laterality Date  . APPENDECTOMY     2007    There were no vitals filed for this visit.  Subjective Assessment - 07/10/19 1559    Subjective  Pt states that the TG soft was wanting to roll down. She had to fold it but she kept her shorts on to help    Pertinent History  No significant medical history.    Patient Stated Goals  I want to get to where I can begin walking again and getting exercise. I want to know what things I definitely need to do for my legs not to swell again.    Currently in Pain?  No/denies    Multiple Pain Sites  No                  Outpatient Rehab from 06/26/2019 [facility]  Lymphedema Life Impact Scale Total Score  19.12 %           [facility] Adult PT Treatment/Exercise - 07/10/19 0001      Manual Therapy   Manual Therapy  Manual Lymphatic Drainage (MLD);Compression Bandaging    Manual therapy comments  Pt bil lower legs were washed and rinsed with soap/warm water, dried and then thick lotion was applied. She continues with desquamation L>R at the lower legs and significant discoloration at the L lower leg that improves with elevation.     Manual Lymphatic Drainage (MLD)  In supine:  short neck, swimming in the terminus, BIl axillary and inguinal nodes, Bil inguino-axillary anastomosis, on Bil legs LLE first followed by RLE: medial to lateral thigh, lateral thigh, bottle neck of the knee, posterior knee, all surfaces of the lower leg, dorsum of the foot, re-worked all surfaces 3 times then anastomosis followed by deep abdominals    Compression Bandaging  TG soft as a base layer for foot to groin Bil. Pt was wrapped with short stretch bandages from foot to knee on the R and foot to groin on the L. 3 short stretch bandages were used on the RLE and 5 on the L to mid thigh.              PT Education - 07/10/19 1701    Education Details  Pt will continue with wearing her compression wraps and elevating her legs. Discussed POC including signing up for March 26th measurements with Lenna Sciara from [facility]) Educated  Patient    Methods  Explanation    Comprehension  Verbalized understanding       PT Short Term Goals - 06/26/19 1640      PT SHORT TERM GOAL #1  Title  Pt will be independent with HEP and self MLD in order to demonstrate autonomy of care.    Baseline  Pt does not know how to perform self MLD or have an HEP    Time  3    Period  Weeks    Status  New    Target Date  07/24/19        PT Long Term Goals - 06/26/19 1640      PT LONG TERM GOAL #1   Title  Patient will have reduction of limb girth by 3 cm in BIl lower legs and thighs to demonstrate decreased fluid and effectiveness of treatment.    Baseline  see measurements.    Time  6    Period  Weeks    Status  New    Target Date  08/14/19      PT LONG TERM GOAL #2   Title  Patient to be properly fitted with compression garment to wear on daily basis.    Baseline  Pt does not have garment.    Time  6    Period  Weeks    Status  New    Target Date  08/14/19      PT LONG TERM GOAL #3   Title  Patient will be independent in prevention/self-care management principles including self-massage  and long term management plan for lymphedema    Baseline  pt is unaware of how to manage lymphedema.    Time  6    Period  Weeks    Status  New    Target Date  08/14/19      PT LONG TERM GOAL #4   Title  Pt will demonstrate less than 10% on LLIS to demonstrate improvement in quality of life related to lymphedema.    Baseline  19%    Time  6    Period  Weeks    Status  New    Target Date  08/14/19            Plan - 07/10/19 1558    Clinical Impression Statement  Pt presents to physical therapy with her compression wraps removed that she removed in the waiting room. Her Bil LE were washed. MLD was performed with legs elevated and skin care was performed prior to donning compression wrap. Small amount of sanguinous fluid due to desquamated skin fell off when pt removed TG soft; ABDpad was placed on posterior calf to keep fluid from getting on wrap. Continued with use of extra long TG soft folded at the top to facilitate fluid flow out of the Bil thighs. Pt will benefit from continued POC at this time.    Personal Factors and Comorbidities  Comorbidity 1    Comorbidities  obesity    Rehab Potential  Good    PT Duration  6 weeks    PT Treatment/Interventions  Therapeutic activities;Therapeutic exercise;Manual techniques;Patient/family education    PT Next Visit Plan  Continue with MLD and provide education, continue with wrapping, assess last wrap, sign up for March 26th measurements with [facility].    PT Home Exercise Plan  phlebolymphedema exercises/deep breathing    Consulted and Agree with Plan of Care  Patient       Patient will benefit from skilled therapeutic intervention in order to improve the following deficits and impairments:  Increased edema, Decreased mobility  Visit Diagnosis: Lymphedema, not elsewhere classified     Problem List There are no problems to display for this  patient.   Claudia Desanctis, PT 07/10/2019, 5:04 PM  [facility] [address] [place], [place], [address] Phone: [phone]   Fax:  [phone]  Name: Kathryn Schmitt MRN: [id number] Date of Birth: 1955-05-14

## 2019-07-15 ENCOUNTER — Ambulatory Visit: Payer: 59

## 2019-07-15 ENCOUNTER — Other Ambulatory Visit: Payer: Self-pay

## 2019-07-15 DIAGNOSIS — I890 Lymphedema, not elsewhere classified: Secondary | ICD-10-CM

## 2019-07-15 NOTE — Therapy (Signed)
[place], [place], [address] Phone: [phone]   Fax:  [phone]  Physical Therapy Treatment  Patient Details  Name: Kathryn Schmitt MRN: [id number] Date of Birth: [birth year] Referring Provider (PT): Linton Ham MD   Encounter Date: 07/15/2019  PT End of Session - 07/15/19 1605    Visit Number  6    Number of Visits  13    Date for PT Re-Evaluation  08/14/19    PT Start Time  1604    PT Stop Time  1705    PT Time Calculation (min)  61 min    Activity Tolerance  Patient tolerated treatment well    Behavior During Therapy  [facility] for tasks assessed/performed       No past medical history on file.  Past Surgical History:  Procedure Laterality Date  . APPENDECTOMY     2007    There were no vitals filed for this visit.  Subjective Assessment - 07/15/19 1605    Subjective  Pt states that she removed her wraps on Sunday to shower and then tried to wrap them again. she states that the area on the posterior aspect of her L lower leg with small amount of sanguinous drainage had a small amount of drainage again.    Pertinent History  No significant medical history.    Patient Stated Goals  I want to get to where I can begin walking again and getting exercise. I want to know what things I definitely need to do for my legs not to swell again.    Currently in Pain?  No/denies    Multiple Pain Sites  No                  Outpatient [facility]  Lymphedema Life Impact Scale Total Score  19.12 %           [facility] Adult PT Treatment/Exercise - 07/15/19 0001      Manual Therapy   Manual Therapy  Manual Lymphatic Drainage (MLD);Compression Bandaging    Manual therapy comments  Pt bil lower legs were washed and rinsed with soap/warm water, dried and then thick lotion was applied. She continues with desquamation L>R at the lower  legs and significant discoloration at the L lower leg that improves with elevation. ABD pad over the mid posterior L lower leg and anterior/medial L leg over small open areas with minute sanguinous drainage due to desquamated skin coming off with TG soft.     Manual Lymphatic Drainage (MLD)  In supine: short neck, swimming in the terminus, BIl axillary and inguinal nodes, Bil inguino-axillary anastomosis, on Bil legs LLE first followed by RLE: medial to lateral thigh, lateral thigh, bottle neck of the knee, posterior knee, all surfaces of the lower leg, dorsum of the foot, re-worked all surfaces 3 times then anastomosis followed by deep abdominals    Compression Bandaging  TG soft as base layer Bil with artiflex for shaping and 4 short stretch bandages on the L and 3 on the R. Pt continues to wear her compression shorts for lymphedema vs. lipedemema in the thighs.              PT Education - 07/15/19 1710    Education Details  Pt will continue with wearing her compression wraps and elevated her legs. She will wrap at home if she takes her wraps off and was provided with handout.  Person(s) Educated  Patient    Methods  Explanation;Handout    Comprehension  Verbalized understanding       PT Short Term Goals - 06/26/19 1640      PT SHORT TERM GOAL #1   Title  Pt will be independent with HEP and self MLD in order to demonstrate autonomy of care.    Baseline  Pt does not know how to perform self MLD or have an HEP    Time  3    Period  Weeks    Status  New    Target Date  07/24/19        PT Long Term Goals - 06/26/19 1640      PT LONG TERM GOAL #1   Title  Patient will have reduction of limb girth by 3 cm in BIl lower legs and thighs to demonstrate decreased fluid and effectiveness of treatment.    Baseline  see measurements.    Time  6    Period  Weeks    Status  New    Target Date  08/14/19      PT LONG TERM GOAL #2   Title  Patient to be properly fitted with compression  garment to wear on daily basis.    Baseline  Pt does not have garment.    Time  6    Period  Weeks    Status  New    Target Date  08/14/19      PT LONG TERM GOAL #3   Title  Patient will be independent in prevention/self-care management principles including self-massage and long term management plan for lymphedema    Baseline  pt is unaware of how to manage lymphedema.    Time  6    Period  Weeks    Status  New    Target Date  08/14/19      PT LONG TERM GOAL #4   Title  Pt will demonstrate less than 10% on LLIS to demonstrate improvement in quality of life related to lymphedema.    Baseline  19%    Time  6    Period  Weeks    Status  New    Target Date  08/14/19            Plan - 07/15/19 1605    Clinical Impression Statement  Pt presents to physical therapy with her wraps off. She states she had removed her wraps and re-applied due to she wanted to take a shower. She removed her wraps in the waiting room. Her Bil LE were washed. She has a small open area on the posterior aspect of her L lower leg that was covered wtih ABD pad and a pinprick area on the anterior/medial leg that was covered with ABD pad. MLD was performed with leg elevated following skin care. Compressoin wraps were donned to Bil lower legs. Pt was signed up for time to be measured for compression garments with Melissa from [place]. Pt will benefit from continued POC at this time.    Personal Factors and Comorbidities  Comorbidity 1    Comorbidities  obesity    Rehab Potential  Good    PT Frequency  2x / week    PT Duration  6 weeks    PT Treatment/Interventions  Therapeutic activities;Therapeutic exercise;Manual techniques;Patient/family education    PT Next Visit Plan  Continue with MLD and provide education, continue with wrapping, assess last wrap, sign up for March 26th measurements with [facility].  PT Home Exercise Plan  phlebolymphedema exercises/deep breathing    Recommended Other Services  getting  measuredfor garments 07/25/19 at 11:30 am    Consulted and Agree with Plan of Care  Patient       Patient will benefit from skilled therapeutic intervention in order to improve the following deficits and impairments:  Increased edema, Decreased mobility  Visit Diagnosis: Lymphedema, not elsewhere classified     Problem List There are no problems to display for this patient.   Claudia Desanctis, PT 07/15/2019, 5:15 PM  [facility] [address] [place], [place], [address] Phone: [phone]   Fax:  [phone]  Name: JONIE BURDELL MRN: [id number] Date of Birth: 10-Nov-1955

## 2019-07-17 ENCOUNTER — Ambulatory Visit: Payer: 59

## 2019-07-22 ENCOUNTER — Other Ambulatory Visit: Payer: Self-pay

## 2019-07-22 ENCOUNTER — Ambulatory Visit: Payer: 59

## 2019-07-22 DIAGNOSIS — I890 Lymphedema, not elsewhere classified: Secondary | ICD-10-CM

## 2019-07-22 NOTE — Therapy (Signed)
[place], [place], [address] Phone: [phone]   Fax:  [phone]  Physical Therapy Treatment  Patient Details  Name: Kathryn Schmitt MRN: [id number] Date of Birth: April 14, 1956 Referring Provider (PT): Linton Ham MD   Encounter Date: 07/22/2019  PT End of Session - 07/22/19 1618    Visit Number  7    Number of Visits  13    Date for PT Re-Evaluation  08/14/19    PT Start Time  2876    PT Stop Time  1716    PT Time Calculation (min)  69 min    Activity Tolerance  Patient tolerated treatment well    Behavior During Therapy  [facility] for tasks assessed/performed       No past medical history on file.  Past Surgical History:  Procedure Laterality Date  . APPENDECTOMY     2007    There were no vitals filed for this visit.  Subjective Assessment - 07/22/19 1618    Subjective  Pt states that when her compression doesn't cover her knee's she has pain at her knees. She reports that other than this she has not had any issues.    Pertinent History  No significant medical history.    Patient Stated Goals  I want to get to where I can begin walking again and getting exercise. I want to know what things I definitely need to do for my legs not to swell again.    Currently in Pain?  No/denies    Multiple Pain Sites  No                  Outpatient Re[facility]/26/2019 [facility]  Lymphedema Life Impact Scale Total Score  19.12 %           [facility] Adult PT Treatment/Exercise - 07/22/19 0001      Manual Therapy   Manual Therapy  Manual Lymphatic Drainage (MLD);Compression Bandaging    Manual therapy comments  Pt bil lower legs were washed and rinsed with soap/warm water, dried and then thick lotion was applid. She continues with desquamation L>R at the lower legs and significant discoloration at the L lower leg that improves with elevation.  non-stick adhesive was added over small sanguinous area on the proximal/posteroir/lateral lower leg.     Manual Lymphatic Drainage (MLD)  In supine: short neck, swimming in the terminus, BIl axillary and inguinal nodes, Bil inguino-axillary anastomosis, on Bil legs LLE first followed by RLE: medial to lateral thigh, lateral thigh, bottle neck of the knee, posterior knee, all surfaces of the lower leg, dorsum of the foot, re-worked all surfaces 3 times then anastomosis followed by deep abdominals    Compression Bandaging  TG soft as base layer Bil with artiflex for shaping and 4 short stretch bandages on the L and 3 on the R. Pt continues to wear her compression shorts for lymphedema vs. lipedemema in the thighs.              PT Education - 07/22/19 1718    Education Details  Pt will continue with wearing her cmpression wraps an elevate her legs. She will begin practicing wrapping at home due to length of time it takes to get garments after her final appointment.    Person(s) Educated  Patient    Methods  Explanation    Comprehension  Verbalized understanding       PT Short Term Goals - 06/26/19  1640      PT SHORT TERM GOAL #1   Title  Pt will be independent with HEP and self MLD in order to demonstrate autonomy of care.    Baseline  Pt does not know how to perform self MLD or have an HEP    Time  3    Period  Weeks    Status  New    Target Date  07/24/19        PT Long Term Goals - 06/26/19 1640      PT LONG TERM GOAL #1   Title  Patient will have reduction of limb girth by 3 cm in BIl lower legs and thighs to demonstrate decreased fluid and effectiveness of treatment.    Baseline  see measurements.    Time  6    Period  Weeks    Status  New    Target Date  08/14/19      PT LONG TERM GOAL #2   Title  Patient to be properly fitted with compression garment to wear on daily basis.    Baseline  Pt does not have garment.    Time  6    Period  Weeks    Status  New    Target  Date  08/14/19      PT LONG TERM GOAL #3   Title  Patient will be independent in prevention/self-care management principles including self-massage and long term management plan for lymphedema    Baseline  pt is unaware of how to manage lymphedema.    Time  6    Period  Weeks    Status  New    Target Date  08/14/19      PT LONG TERM GOAL #4   Title  Pt will demonstrate less than 10% on LLIS to demonstrate improvement in quality of life related to lymphedema.    Baseline  19%    Time  6    Period  Weeks    Status  New    Target Date  08/14/19            Plan - 07/22/19 1617    Clinical Impression Statement  Pt presents to physical therapy with her wraps off. She removed the wraps in the clinic. Her Bil Le were wahsed. She has a small amount of sanguinous drainaged when washing on the posterior/lateral proximal L lower leg that was covered wtih non-adhesive dressing prior to wrapping. MLD was performed with the legs elevated following skin care. Compression wraps were donned to Bil lower legs. Discussed possible compression varieties this session. Pt is concerned that she is not in network with pump company and will e-mail company she is in network with today. Pt will benefit from continued POC at this time.    Personal Factors and Comorbidities  Comorbidity 1    Comorbidities  obesity    Rehab Potential  Good    PT Frequency  2x / week    PT Duration  6 weeks    PT Treatment/Interventions  Therapeutic activities;Therapeutic exercise;Manual techniques;Patient/family education    PT Next Visit Plan  Continue with MLD and provide education, continue with wrapping, assess last wrap, sign up for March 26th measurements with [facility]. contact DME place??    PT Home Exercise Plan  phlebolymphedema exercises/deep breathing    Consulted and Agree with Plan of Care  Patient       Patient will benefit from skilled therapeutic intervention in order to improve the  following deficits and  impairments:  Increased edema, Decreased mobility  Visit Diagnosis: Lymphedema, not elsewhere classified     Problem List There are no problems to display for this patient.   Claudia Desanctis, PT 07/22/2019, 5:22 PM  [facility] [address] [place], [place], [address] Phone: [phone]   Fax:  [phone]  Name: Kathryn Schmitt MRN: [id number] Date of Birth: [birth year]

## 2019-07-24 ENCOUNTER — Other Ambulatory Visit: Payer: Self-pay

## 2019-07-24 ENCOUNTER — Ambulatory Visit: Payer: 59

## 2019-07-24 DIAGNOSIS — I890 Lymphedema, not elsewhere classified: Secondary | ICD-10-CM | POA: Diagnosis not present

## 2019-07-24 NOTE — Therapy (Signed)
[place], [place], [address] Phone: [phone]   Fax:  [phone]  Physical Therapy Treatment  Patient Details  Name: Kathryn Schmitt MRN: [id number] Date of Birth: [birth year] Referring Provider (PT): Linton Ham MD   Encounter Date: 07/24/2019  PT End of Session - 07/24/19 1601    Visit Number  8    Number of Visits  13    Date for PT Re-Evaluation  08/14/19    PT Start Time  1600    PT Stop Time  1700    PT Time Calculation (min)  60 min    Activity Tolerance  Patient tolerated treatment well    Behavior During Therapy  [facility] for tasks assessed/performed       No past medical history on file.  Past Surgical History:  Procedure Laterality Date  . APPENDECTOMY     2007    There were no vitals filed for this visit.  Subjective Assessment - 07/24/19 1601    Subjective  Pt states that she is feeling pretty good today. She has no pain.    Pertinent History  No significant medical history.                  Outpatient Re[facility] from 06/26/[facility]  Lymphedema Life Impact Scale Total Score  19.12 %           [facility] Adult PT Treatment/Exercise - 07/24/19 0001      Manual Therapy   Manual Therapy  Manual Lymphatic Drainage (MLD);Compression Bandaging    Manual therapy comments  Pt bil lower legs were washed and rinsed with soap/warm water, dried and then thick lotion was applid. She continues with desquamation L>R at the lower legs and significant discoloration at the L lower leg that has improved over all and continues to improves with elevation. non-stick adhesive was added over small sanguinous area on the proximal/posteroir/lateral lower leg.     Manual Lymphatic Drainage (MLD)  In supine: short neck, swimming in the terminus, BIl axillary and inguinal nodes, Bil inguino-axillary anastomosis, on Bil legs LLE first followed by RLE:  medial to lateral thigh, lateral thigh, bottle neck of the knee, posterior knee, all surfaces of the lower leg, dorsum of the foot, re-worked all surfaces 3 times then anastomosis followed by deep abdominals    Compression Bandaging  TG soft as base layer Bil with artiflex for shaping and 4 short stretch bandages on the L and 3 on the R. Pt continues to wear her compression shorts for lymphedema vs. lipedemema in the thighs.              PT Education - 07/24/19 1704    Education Details  Pt will continue wearing her compression wraps with her legs elevated and will continue practicing wrapping at home.    Person(s) Educated  Patient    Methods  Explanation    Comprehension  Verbalized understanding       PT Short Term Goals - 06/26/19 1640      PT SHORT TERM GOAL #1   Title  Pt will be independent with HEP and self MLD in order to demonstrate autonomy of care.    Baseline  Pt does not know how to perform self MLD or have an HEP    Time  3    Period  Weeks    Status  New    Target Date  07/24/19  PT Long Term Goals - 06/26/19 1640      PT LONG TERM GOAL #1   Title  Patient will have reduction of limb girth by 3 cm in BIl lower legs and thighs to demonstrate decreased fluid and effectiveness of treatment.    Baseline  see measurements.    Time  6    Period  Weeks    Status  New    Target Date  08/14/19      PT LONG TERM GOAL #2   Title  Patient to be properly fitted with compression garment to wear on daily basis.    Baseline  Pt does not have garment.    Time  6    Period  Weeks    Status  New    Target Date  08/14/19      PT LONG TERM GOAL #3   Title  Patient will be independent in prevention/self-care management principles including self-massage and long term management plan for lymphedema    Baseline  pt is unaware of how to manage lymphedema.    Time  6    Period  Weeks    Status  New    Target Date  08/14/19      PT LONG TERM GOAL #4   Title  Pt  will demonstrate less than 10% on LLIS to demonstrate improvement in quality of life related to lymphedema.    Baseline  19%    Time  6    Period  Weeks    Status  New    Target Date  08/14/19            Plan - 07/24/19 1601    Clinical Impression Statement  Pt L lower leg has improved coloration this session with less rubor. She continues with one small open area with small amount of sero-sanguinous fluid on non-adhesive pad with removal of bandage. L lower leg was covered with non-adhesive dresing prior to MLD and wrapping. MLD was performed with the legs elevated following skin care and dressing. Compression wraps were donned to Bil lower legs. No signs/symptoms of infection at small open area including no increaesd redness, wramth or odor. Pt is coming to get measured for compression tomorrow.  Pt will benefit form continued POC at this time.    Personal Factors and Comorbidities  Comorbidity 1    Comorbidities  obesity    Rehab Potential  Good    PT Frequency  2x / week    PT Duration  6 weeks    PT Treatment/Interventions  Therapeutic activities;Therapeutic exercise;Manual techniques;Patient/family education    PT Next Visit Plan  Continue with MLD and provide education, continue with wrapping, assess last wrap, sign up for March 26th measurements with [facility]. contact DME place??    PT Home Exercise Plan  phlebolymphedema exercises/deep breathing    Consulted and Agree with Plan of Care  Patient       Patient will benefit from skilled therapeutic intervention in order to improve the following deficits and impairments:  Increased edema, Decreased mobility  Visit Diagnosis: Lymphedema, not elsewhere classified     Problem List There are no problems to display for this patient.   Claudia Desanctis, PT 07/24/2019, 5:07 PM  [facility] [address] [place], [place], [address] Phone: [phone]   Fax:   [phone]  Name: Kathryn Schmitt MRN: [id number] Date of Birth: 28-Aug-1955

## 2019-07-29 ENCOUNTER — Other Ambulatory Visit: Payer: Self-pay

## 2019-07-29 ENCOUNTER — Ambulatory Visit: Payer: 59

## 2019-07-29 DIAGNOSIS — I890 Lymphedema, not elsewhere classified: Secondary | ICD-10-CM

## 2019-07-29 NOTE — Therapy (Signed)
[place], [place], [address] Phone: [phone]   Fax:  [phone]  Physical Therapy Progress Note Progress Note Reporting Period 06/26/2019 to 07/29/2019  See note below for Objective Data and Assessment of Progress/Goals.      Patient Details  Name: TAKASHA VETERE MRN: [id number] Date of Birth: [birth year] Referring Provider (PT): Linton Ham MD   Encounter Date: 07/29/2019  PT End of Session - 07/29/19 1618    Visit Number  9    Number of Visits  13    Date for PT Re-Evaluation  08/26/19    PT Start Time  2409    PT Stop Time  7353    PT Time Calculation (min)  70 min    Activity Tolerance  Patient tolerated treatment well    Behavior During Therapy  [facility] for tasks assessed/performed       No past medical history on file.  Past Surgical History:  Procedure Laterality Date  . APPENDECTOMY     2007    There were no vitals filed for this visit.  Subjective Assessment - 07/29/19 1619    Subjective  Pt states that the people from the pump company called her yesterday and she told them what she was fitted for when the other DME company came out. She is feeling good and wants to try wrapping at home for a week.    Pertinent History  No significant medical history.    Patient Stated Goals  I want to get to where I can begin walking again and getting exercise. I want to know what things I definitely need to do for my legs not to swell again.    Currently in Pain?  No/denies    Multiple Pain Sites  No            LYMPHEDEMA/ONCOLOGY QUESTIONNAIRE - 07/29/19 1620      Right Lower Extremity Lymphedema   20 cm Proximal to Suprapatella  76.4 cm    10 cm Proximal to Suprapatella  74.8 cm    At Midpatella/Popliteal Crease  57.9 cm    30 cm Proximal to Floor at Lateral Plantar Foot  53.2 cm    20 cm Proximal to Floor at Lateral Plantar Foot  42 1    10 cm Proximal to Floor at  Lateral Malleoli  32.1 cm    5 cm Proximal to 1st MTP Joint  24.5 cm    Across MTP Joint  22.4 cm    Around Proximal Great Toe  8.4 cm      Left Lower Extremity Lymphedema   20 cm Proximal to Suprapatella  76 cm    10 cm Proximal to Suprapatella  72 cm    At Midpatella/Popliteal Crease  54.4 cm    30 cm Proximal to Floor at Lateral Plantar Foot  50.5 cm    20 cm Proximal to Floor at Lateral Plantar Foot  40 cm    10 cm Proximal to Floor at Lateral Malleoli  34.5 cm    5 cm Proximal to 1st MTP Joint  23.8 cm    Across MTP Joint  22.5 cm    Around Proximal Great Toe  8.4 cm           Outpatient Re[facility]  Lymphedema Life Impact Scale Total Score  19.12 %           [facility] Adult PT Treatment/Exercise - 07/29/19  0001      Manual Therapy   Manual Therapy  Manual Lymphatic Drainage (MLD);Compression Bandaging    Manual therapy comments  Pt bil lower legs were washed and rinsed with soap/warm water, dried and then thick lotion was applid. She continues with desquamation L>R at the lower legs and significant discoloration at the L lower leg that has improved over all and continues to improves with elevation. non-stick adhesive was added over small sanguinous area on the proximal/posteroir/lateral lower leg and anterior leg where desquamated skin is pulling off.     Manual Lymphatic Drainage (MLD)  In supine: short neck, swimming in the terminus, BIl axillary and inguinal nodes, Bil inguino-axillary anastomosis, on Bil legs LLE first followed by RLE: medial to lateral thigh, lateral thigh, bottle neck of the knee, posterior knee, all surfaces of the lower leg, dorsum of the foot, re-worked all surfaces 3 times then anastomosis followed by deep abdominals    Compression Bandaging  TG soft as base layer Bil with artiflex for shaping and 3 short stretch bandages BIl. She continues to wear her compression shorts               PT Education - 07/29/19 1740    Education Details  Pt will continue with self MLD, compression wraps, elevation and exercise at home. She will return in one week after self wrapping to demonstrate autonomy of care.    Person(s) Educated  Patient    Methods  Explanation    Comprehension  Verbalized understanding       PT Short Term Goals - 07/29/19 1744      PT SHORT TERM GOAL #1   Title  Pt will be independent with HEP and self MLD in order to demonstrate autonomy of care.    Baseline  Pt has been performing MLD and HEP at home.    Status  Achieved        PT Long Term Goals - 07/29/19 1744      PT LONG TERM GOAL #1   Title  Patient will have reduction of limb girth by 3 cm in BIl lower legs and thighs to demonstrate decreased fluid and effectiveness of treatment.    Baseline  See measurements.    Status  Achieved      PT LONG TERM GOAL #2   Title  Patient to be properly fitted with compression garment to wear on daily basis.    Baseline  Pt is waiting for this and it has been shipped.    Time  4    Period  Weeks    Status  On-going    Target Date  08/26/19      PT LONG TERM GOAL #3   Title  Patient will be independent in prevention/self-care management principles including self-massage and long term management plan for lymphedema    Baseline  Pt has learned how to manage lymphedema nad home and will practice self wrapping this week.    Time  4    Period  Weeks    Status  On-going    Target Date  08/26/19      PT LONG TERM GOAL #4   Title  Pt will demonstrate less than 10% on LLIS to demonstrate improvement in quality of life related to lymphedema.    Baseline  not performed today.    Time  4    Period  Weeks    Status  On-going    Target Date  08/26/19  Plan - 07/29/19 1618    Clinical Impression Statement  Pt presents to physical therapy with significant improvement in circumferential measurements since her initial evaluation but  continues with hyperplasia, desquamation of the skin, keratosis that continues due to fluid in the BLE. She has participated in complete decongestive therapy for 4 weeks and will benefit from a vasopneumatic pump due to lymphedema I89.0. She continues with small open areas on her L lower leg due to desquamated skin pulling away from her leg that were covered with non-adhesive pads and ABD pads for cushioning. MLD was performed with th elegs elevated following skin care. Compression wraps were donned to BIl lower legs and she continues to wear compression shorts at home. Pt has been ordered compression garments and they have been shipped. She will work on self wrapping at home in order to demonstrate accuracy. Pt will benefit form continued POC 2x/week for 4 weeks if needed while waiting on needed equipment including compression garments and vasopneumatic pump at the abdominal level due to thigh high lymphedema.    Personal Factors and Comorbidities  Comorbidity 1    Comorbidities  obesity    Rehab Potential  Good    PT Frequency  2x / week    PT Duration  4 weeks    PT Treatment/Interventions  Therapeutic activities;Therapeutic exercise;Manual techniques;Patient/family education    PT Next Visit Plan  Continue with MLD and provide education, continue with wrapping, assess last wrap, sign up for March 26th measurements with [facility]. contact DME place??    PT Home Exercise Plan  phlebolymphedema exercises/deep breathing    Consulted and Agree with Plan of Care  Patient       Patient will benefit from skilled therapeutic intervention in order to improve the following deficits and impairments:  Increased edema, Decreased mobility  Visit Diagnosis: Lymphedema, not elsewhere classified     Problem List There are no problems to display for this patient.   Claudia Desanctis , PT 07/29/2019, 5:46 PM  [facility] [address] [place],  [place], [address] Phone: [phone]   Fax:  [phone]  Name: DIVYA MUNSHI MRN: [id number] Date of Birth: 1955/11/19

## 2019-08-05 ENCOUNTER — Ambulatory Visit: Payer: 59 | Attending: Internal Medicine

## 2019-08-05 ENCOUNTER — Other Ambulatory Visit: Payer: Self-pay

## 2019-08-05 DIAGNOSIS — I890 Lymphedema, not elsewhere classified: Secondary | ICD-10-CM | POA: Insufficient documentation

## 2019-08-05 NOTE — Therapy (Addendum)
[place], [place], [address] Phone: [phone]   Fax:  [phone]  Physical Therapy Treatment  Patient Details  Name: Kathryn Schmitt MRN: [id number] Date of Birth: Feb 09, 1956 Referring Provider (PT): Linton Ham MD   Encounter Date: 08/05/2019  PT End of Session - 08/05/19 1541    Visit Number  10    Number of Visits  13    Date for PT Re-Evaluation  08/26/19    PT Start Time  1749    PT Stop Time  1650    PT Time Calculation (min)  70 min    Activity Tolerance  Patient tolerated treatment well    Behavior During Therapy  [facility] for tasks assessed/performed       No past medical history on file.  Past Surgical History:  Procedure Laterality Date  . APPENDECTOMY     2007    There were no vitals filed for this visit.  Subjective Assessment - 08/05/19 1541    Subjective  Pt states that wrapping has gone well at home. Pt statesshe has no concerns.    Pertinent History  No significant medical history.    Patient Stated Goals  I want to get to where I can begin walking again and getting exercise. I want to know what things I definitely need to do for my legs not to swell again.    Currently in Pain?  No/denies    Multiple Pain Sites  No            LYMPHEDEMA/ONCOLOGY QUESTIONNAIRE - 08/05/19 1628      Right Lower Extremity Lymphedema   20 cm Proximal to Suprapatella  77 cm    10 cm Proximal to Suprapatella  73 cm    At Midpatella/Popliteal Crease  51.8 cm    30 cm Proximal to Floor at Lateral Plantar Foot  53.5 cm    20 cm Proximal to Floor at Lateral Plantar Foot  43 1    10 cm Proximal to Floor at Lateral Malleoli  31.8 cm    5 cm Proximal to 1st MTP Joint  24.5 cm    Across MTP Joint  22.7 cm    Around Proximal Great Toe  8.6 cm      Left Lower Extremity Lymphedema   20 cm Proximal to Suprapatella  77 cm    10 cm Proximal to Suprapatella  72 cm    At  Midpatella/Popliteal Crease  55 cm    30 cm Proximal to Floor at Lateral Plantar Foot  47.5 cm    20 cm Proximal to Floor at Lateral Plantar Foot  40.9 cm    10 cm Proximal to Floor at Lateral Malleoli  31.7 cm    5 cm Proximal to 1st MTP Joint  24 cm    Across MTP Joint  22.8 cm    Around Proximal Great Toe  8.4 cm           Outpatient Rehab from 08/05/2019 [facility]  Lymphedema Life Impact Scale Total Score  11.76 %           [facility] Adult PT Treatment/Exercise - 08/05/19 0001      Manual Therapy   Manual Therapy  Manual Lymphatic Drainage (MLD);Compression Bandaging    Manual therapy comments  Pt bil lower legs were washed and rinsed with soap/warm water, dried and then thick lotion was applid. She continues with desquamation L>R at the lower  legs and significant discoloration at the L lower leg that has improved over all and continues to improves with elevation. non-stick adhesive was added over small sanguinous area on the proximal/posteroir/lateral lower leg and anterior leg where desquamated skin is pulling off.     Manual Lymphatic Drainage (MLD)  In supine: short neck, swimming in the terminus, BIl axillary and inguinal nodes, Bil inguino-axillary anastomosis, on Bil legs LLE first followed by RLE: medial to lateral thigh, lateral thigh, bottle neck of the knee, posterior knee, all surfaces of the lower leg, dorsum of the foot, re-worked all surfaces 3 times then anastomosis followed by deep abdominals    Compression Bandaging  TG soft as base layer Bil with artiflex for shaping and 3 short stretch bandages BIl. She continues to wear her compression shorts              PT Education - 08/05/19 1658    Education Details  Pt will continue with self MLD, compression wraps, elevation and exercise at home. She will continue this on the L lower leg until her skin has healed adequately and on the R until she receives her compression garments.     Person(s) Educated  Patient    Methods  Explanation    Comprehension  Verbalized understanding       PT Short Term Goals - 07/29/19 1744      PT SHORT TERM GOAL #1   Title  Pt will be independent with HEP and self MLD in order to demonstrate autonomy of care.    Baseline  Pt has been performing MLD and HEP at home.    Status  Achieved        PT Long Term Goals - 08/05/19 1651      PT LONG TERM GOAL #2   Title  Patient to be properly fitted with compression garment to wear on daily basis.    Baseline  Pt is continueing to wait for this she is independent with wrapping her legs.    Status  Achieved      PT LONG TERM GOAL #3   Title  Patient will be independent in prevention/self-care management principles including self-massage and long term management plan for lymphedema    Baseline  Pt is indepdent with self wrapping, MLD and exercise.    Status  Achieved      PT LONG TERM GOAL #4   Title  Pt will demonstrate less than 10% on LLIS to demonstrate improvement in quality of life related to lymphedema.    Baseline  11.76    Time  4    Period  Weeks    Status  Partially Met    Target Date  08/26/19            Plan - 08/05/19 1540    Clinical Impression Statement  Pt has met all of her goals except for LLIS but has reduced her lymphdema life impact scale by 8% since her initial evaluation. She continues with desquamated skin sloughing off the L lower leg that is causing small open areas and pt is aware that she will wrap this leg until her skin has cleared more and she is able to wear her compression garment without tearing off desquamated skin. ABD pads were placed over the L lower leg to help decrease friction on desquamated skin after skin care. MLD was performed with the legs elevated following skin care. Compression wraps were donned to the Bil lower legs and she continues to  wear compression shorts. Pt will continue to wrap at home and return only if she needs  assistance over the next 30 days.    Personal Factors and Comorbidities  Comorbidity 1    Comorbidities  obesity    Rehab Potential  Good    PT Frequency  2x / week    PT Duration  4 weeks    PT Treatment/Interventions  Therapeutic activities;Therapeutic exercise;Manual techniques;Patient/family education    PT Next Visit Plan  MLD/wrapping as needed pt will return only if needed.    PT Home Exercise Plan  phlebolymphedema exercises/deep breathing    Consulted and Agree with Plan of Care  Patient       Patient will benefit from skilled therapeutic intervention in order to improve the following deficits and impairments:  Increased edema, Decreased mobility  Visit Diagnosis: Lymphedema, not elsewhere classified     Problem List There are no problems to display for this patient.  PHYSICAL THERAPY DISCHARGE SUMMARY   Plan:                                                    Patient goals were met. Patient is being discharged due to not returning since the last visit.  ?????       Ander Purpura, PT 08/05/2019, 5:09 PM  [place], [place], [address] Phone: [phone]   Fax:  [phone]  Name: Kathryn Schmitt MRN: [id number] Date of Birth: 10-15-1955

## 2019-10-28 NOTE — Progress Notes (Signed)
Kathryn Schmitt, Seraphim H. ([id number]) Visit Report for 06/24/2019 Arrival Information Details Patient Name: Date of Service: Kathryn GammonWESTMO RELA [place], [place] [facility] [facility]. 06/24/2019 3:30 PM Medical Record Number: [id number] Patient Account Number: [id number] Date of Birth/Sex: Treating RN: 11/14/1955 (64 y.o. Freddy FinnerF) Epps, Carrie Primary Care Aboubacar Matsuo: PA Zenovia [place], [place] Other Clinician: Referring Fabiana Dromgoole: Treating Desirey Keahey/Extender: Valentino Saxonobson, Michael Weeks in Treatment: 2 Visit Information History Since Last Visit Added or deleted any medications: No Patient Arrived: Ambulatory Any new allergies or adverse reactions: No Arrival Time: 16:40 Had a fall or experienced change in No Accompanied By: self activities of daily living that may affect Transfer Assistance: None risk of falls: Patient Identification Verified: Yes Signs or symptoms of abuse/neglect since last visito No Secondary Verification Process Completed: Yes Hospitalized since last visit: No Patient Requires Transmission-Based Precautions: No Implantable device outside of the clinic excluding No Patient Has Alerts: Yes cellular tissue based products placed in the center Patient Alerts: L. ABI unable to obtain since last visit: size of leg Has Dressing in Place as Prescribed: Yes Pain Present Now: No Electronic Signature(s) Signed: 08/13/2019 9:23:26 AM By: Karl Itoawkins, Destiny Entered By: Karl Itoawkins, Destiny on 06/24/2019 16:41:33 -------------------------------------------------------------------------------- Clinic Level of Care Assessment Details Patient Name: Date of Service: Kathryn Schmitt, Kathryn [facility]A NNE H. 06/24/2019 3:30 PM Medical Record Number: [id number] Patient Account Number: [id number] Date of Birth/Sex: Treating RN: 08/02/1955 (64 y.o. Freddy FinnerF) Epps, Carrie Primary Care Tipton Ballow: PA Zenovia [place]IENT, [place] Other Clinician: Referring Jahlani Lorentz: Treating Isobelle Tuckett/Extender: Valentino Saxonobson, Michael Weeks in Treatment: 2 Clinic Level of Care Assessment Items TOOL 4 Quantity  Score X- 1 0 Use when only an EandM is performed on FOLLOW-UP visit ASSESSMENTS - Nursing Assessment / Reassessment X- 1 10 Reassessment of Co-morbidities (includes updates in patient status) X- 1 5 Reassessment of Adherence to Treatment Plan ASSESSMENTS - Wound and Skin A ssessment / Reassessment X - Simple Wound Assessment / Reassessment - one wound 1 5 []  - 0 Complex Wound Assessment / Reassessment - multiple wounds []  - 0 Dermatologic / Skin Assessment (not related to wound area) ASSESSMENTS - Focused Assessment []  - 0 Circumferential Edema Measurements - multi extremities []  - 0 Nutritional Assessment / Counseling / Intervention []  - 0 Lower Extremity Assessment (monofilament, tuning fork, pulses) []  - 0 Peripheral Arterial Disease Assessment (using hand held doppler) ASSESSMENTS - Ostomy and/or Continence Assessment and Care []  - 0 Incontinence Assessment and Management []  - 0 Ostomy Care Assessment and Management (repouching, etc.) PROCESS - Coordination of Care X - Simple Patient / Family Education for ongoing care 1 15 []  - 0 Complex (extensive) Patient / Family Education for ongoing care X- 1 10 Staff obtains ChiropractorConsents, Records, T Results / Process Orders est []  - 0 Staff telephones HHA, Nursing Homes / Clarify orders / etc []  - 0 Routine Transfer to another Facility (non-emergent condition) []  - 0 Routine Hospital Admission (non-emergent condition) []  - 0 New Admissions / Manufacturing engineernsurance Authorizations / Ordering NPWT Apligraf, etc. , []  - 0 Emergency Hospital Admission (emergent condition) X- 1 10 Simple Discharge Coordination []  - 0 Complex (extensive) Discharge Coordination PROCESS - Special Needs []  - 0 Pediatric / Minor Patient Management []  - 0 Isolation Patient Management []  - 0 Hearing / Language / Visual special needs []  - 0 Assessment of Community assistance (transportation, D/C planning, etc.) []  - 0 Additional assistance / Altered  mentation []  - 0 Support Surface(s) Assessment (bed, cushion, seat, etc.) INTERVENTIONS - Wound Cleansing / Measurement X - Simple Wound Cleansing - one  wound 1 5 []  - 0 Complex Wound Cleansing - multiple wounds X- 1 5 Wound Imaging (photographs - any number of wounds) []  - 0 Wound Tracing (instead of photographs) X- 1 5 Simple Wound Measurement - one wound []  - 0 Complex Wound Measurement - multiple wounds INTERVENTIONS - Wound Dressings []  - 0 Small Wound Dressing one or multiple wounds []  - 0 Medium Wound Dressing one or multiple wounds X- 2 20 Large Wound Dressing one or multiple wounds X- 1 5 Application of Medications - topical []  - 0 Application of Medications - injection INTERVENTIONS - Miscellaneous []  - 0 External ear exam []  - 0 Specimen Collection (cultures, biopsies, blood, body fluids, etc.) []  - 0 Specimen(s) / Culture(s) sent or taken to Lab for analysis []  - 0 Patient Transfer (multiple staff / / Similar devices) []  - 0 Simple Staple / Suture removal (25 or less) []  - 0 Complex Staple / Suture removal (26 or more) []  - 0 Hypo / Hyperglycemic Management (close monitor of Blood Glucose) []  - 0 Ankle / Brachial Index (ABI) - do not check if billed separately X- 1 5 Vital Signs Has the patient been seen at the hospital within the last three years: Yes Total Score: 120 Level Of Care: New/Established - Level 4 Electronic Signature(s) Signed: 06/24/2019 6:06:02 PM By: RN Entered By: on 06/24/2019 17:27:08 -------------------------------------------------------------------------------- Encounter Discharge Information Details Patient Name: Date of Service: Schmitt, Kathryn UA NNE H. 06/24/2019 3:30 PM Medical Record Number: Patient Account Number: Date of Birth/Sex: Treating RN: Aug 05, 1955 (64 y.o. Primary Care Vina Byrd: PA , Other Clinician: Referring  Freddy Spadafora: Treating Wilena Tyndall/Extender: in Treatment: 2 Encounter Discharge Information Items Discharge Condition: Stable Ambulatory Status: Ambulatory Discharge Destination: Home Transportation: Private Auto Accompanied By: self Schedule Follow-up Appointment: Yes Clinical Summary of Care: Patient Declined Electronic Signature(s) Signed: 06/24/2019 6:05:13 PM By: Yevonne Pax Entered By: Yevonne Pax on 06/24/2019 17:35:54 -------------------------------------------------------------------------------- Lower Extremity Assessment Details Patient Name: Date of Service: Kathryn Schmitt Schmitt, Kathryn UA NNE H. 06/24/2019 3:30 PM Medical Record Number: [id number] Patient Account Number: [id number] Date of Birth/Sex: Treating RN: 04/18/1956 (64 y.o. Harvest Dark Primary Care Charlena Haub: PA TIENT, NO Other Clinician: Referring Chez Bulnes: Treating Nyiesha Beever/Extender: Zenovia Jordan in Treatment: 2 Edema Assessment Assessed: [Left: No] [Right: No] Edema: [Left: Yes] [Right: Yes] Calf Left: Right: Point of Measurement: 37 cm From Medial Instep 51 cm 49.5 cm Ankle Left: Right: Point of Measurement: 10 cm From Medial Instep 33 cm 31.8 cm Vascular Assessment Pulses: Dorsalis Pedis Palpable: [Left:Yes] [Right:Yes] Electronic Signature(s) Signed: 06/26/2019 8:56:01 AM By: Valentino Saxon RN, BSN Entered By: 06/26/2019 on 06/24/2019 17:08:29 -------------------------------------------------------------------------------- Multi Wound Chart Details Patient Name: Date of Service: Cherylin Mylar Schmitt, Kathryn UA NNE H. 06/24/2019 3:30 PM Medical Record Number: Kathryn Schmitt Patient Account Number: 06/26/2019 Date of Birth/Sex: Treating RN: 24-Aug-1955 (64 y.o. 04/03/1956 Primary Care Larnell Granlund: PA 64, Wynelle Link Other Clinician: Referring Sami Froh: Treating Wania Longstreth/Extender: Valentino Saxon in Treatment: 2 Vital Signs Height(in): 60 Pulse(bpm): 73 Weight(lbs):  280 Blood Pressure(mmHg): 163/74 Body Mass Index(BMI): 55 Temperature(F): 98.2 Respiratory Rate(breaths/min): 20 Photos: [2:No Photos Left Lower Leg - Posterior] [4:No Photos Left T Great oe] [N/A:N/A N/A] Wound Location: [2:Gradually Appeared] [4:Gradually Appeared] [N/A:N/A] Wounding Event: [2:Lymphedema] [4:Inflammatory] [N/A:N/A] Primary Etiology: [2:Lymphedema] [4:Lymphedema] [N/A:N/A] Comorbid History: [2:05/02/2019] [4:12/31/2018] [N/A:N/A] Date Acquired: [2:2] [4:2] [N/A:N/A] Weeks of Treatment: [2:Healed - Epithelialized] [4:Healed - Epithelialized] [N/A:N/A] Wound  Status: [2:0x0x0] [4:0x0x0] [N/A:N/A] Measurements L x W x D (cm) [2:0] [4:0] [N/A:N/A] A (cm) : rea [2:0] [4:0] [N/A:N/A] Volume (cm) : [2:100.00%] [4:100.00%] [N/A:N/A] % Reduction in A rea: [2:100.00%] [4:100.00%] [N/A:N/A] % Reduction in Volume: [2:Full Thickness Without Exposed] [4:Full Thickness Without Exposed] [N/A:N/A] Classification: [2:Support Structures None Present] [4:Support Structures None Present] [N/A:N/A] Exudate Amount: [2:Flat and Intact] [4:Fibrotic scar, thickened scar] [N/A:N/A] Wound Margin: [2:None Present (0%)] [4:None Present (0%)] [N/A:N/A] Granulation Amount: [2:None Present (0%)] [4:None Present (0%)] [N/A:N/A] Necrotic Amount: [2:Fascia: No] [4:Fascia: No] [N/A:N/A] Exposed Structures: [2:Fat Layer (Subcutaneous Tissue) Exposed: No Tendon: No Muscle: No Joint: No Bone: No Large (67-100%)] [4:Fat Layer (Subcutaneous Tissue) Exposed: No Tendon: No Muscle: No Joint: No Bone: No Large (67-100%)] [N/A:N/A] Treatment Notes Electronic Signature(s) Signed: 06/24/2019 6:00:34 PM By: Baltazar Najjar MD Signed: 06/24/2019 6:06:02 PM By: Yevonne Pax RN Entered By: Baltazar Najjar on 06/24/2019 17:47:52 -------------------------------------------------------------------------------- Multi-Disciplinary Care Plan Details Patient Name: Date of Service: Kathryn Schmitt Schmitt, Kathryn UA NNE H. 06/24/2019 3:30  PM Medical Record Number: [id number] Patient Account Number: [id number] Date of Birth/Sex: Treating RN: 05/23/1955 (64 y.o. Freddy Finner Primary Care Kaysen Deal: PA Zenovia Jordan, [place] Other Clinician: Referring Domingos Riggi: Treating Lamoine Magallon/Extender: Valentino Saxon in Treatment: 2 Active Inactive Electronic Signature(s) Signed: 06/24/2019 6:06:02 PM By: Yevonne Pax RN Entered By: Yevonne Pax on 06/24/2019 17:25:32 -------------------------------------------------------------------------------- Pain Assessment Details Patient Name: Date of Service: Kathryn Schmitt Schmitt, Kathryn UA NNE H. 06/24/2019 3:30 PM Medical Record Number: [id number] Patient Account Number: [id number] Date of Birth/Sex: Treating RN: [birth year] (64 y.o. Freddy Finner Primary Care Jessa Stinson: PA Zenovia Jordan, [place] Other Clinician: Referring Lorie Melichar: Treating Stacia Feazell/Extender: Valentino Saxon in Treatment: 2 Active Problems Location of Pain Severity and Description of Pain Patient Has Paino No Site Locations Pain Management and Medication Current Pain Management: Electronic Signature(s) Signed: 06/24/2019 6:06:02 PM By: Yevonne Pax RN Signed: 08/13/2019 9:23:26 AM By: Karl Ito Entered By: Karl Ito on 06/24/2019 16:41:56 -------------------------------------------------------------------------------- Patient/Caregiver Education Details Patient Name: Date of Service: Kathryn Schmitt Schmitt, Kathryn UA NNE H. 2/23/2021andnbsp3:30 PM Medical Record Number: [id number] Patient Account Number: [id number] Date of Birth/Gender: Treating RN: 03/27/1956 (64 y.o. Freddy Finner Primary Care Physician: PA Zenovia Jordan, [place] Other Clinician: Referring Physician: Treating Physician/Extender: Valentino Saxon in Treatment: 2 Education Assessment Education Provided To: Patient Education Topics Provided Wound/Skin Impairment: Methods: Explain/Verbal Responses: State content correctly Electronic Signature(s) Signed: 06/24/2019  6:06:02 PM By: Yevonne Pax RN Entered By: Yevonne Pax on 06/24/2019 17:25:54 -------------------------------------------------------------------------------- Wound Assessment Details Patient Name: Date of Service: Kathryn Schmitt Schmitt, Kathryn UA NNE H. 06/24/2019 3:30 PM Medical Record Number: [id number] Patient Account Number: [id number] Date of Birth/Sex: Treating RN: Oct 30, 1955 (64 y.o. Freddy Finner Primary Care Journe Hallmark: PA Zenovia Jordan, [place] Other Clinician: Referring Taelar Gronewold: Treating Doy Taaffe/Extender: Valentino Saxon in Treatment: 2 Wound Status Wound Number: 2 Primary Etiology: Lymphedema Wound Location: Left Lower Leg - Posterior Wound Status: Healed - Epithelialized Wounding Event: Gradually Appeared Comorbid History: Lymphedema Date Acquired: 05/02/2019 Weeks Of Treatment: 2 Clustered Wound: No Photos Wound Measurements Length: (cm) Width: (cm) Depth: (cm) Area: (cm) Volume: (cm) 0 % Reduction in Area: 100% 0 % Reduction in Volume: 100% 0 Epithelialization: Large (67-100%) 0 Tunneling: No 0 Undermining: No Wound Description Classification: Full Thickness Without Exposed Support Structures Wound Margin: Flat and Intact Exudate Amount: None Present Foul Odor After Cleansing: No Slough/Fibrino No Wound Bed Granulation Amount: None Present (0%) Exposed Structure Necrotic Amount: None Present (0%) Fascia Exposed: No Fat Layer (Subcutaneous Tissue) Exposed:  No Tendon Exposed: No Muscle Exposed: No Joint Exposed: No Bone Exposed: No Electronic Signature(s) Signed: 06/25/2019 5:09:01 PM By: Benjaman Kindler EMT/HBOT Signed: 10/28/2019 5:26:37 PM By: Yevonne Pax RN Previous Signature: 06/24/2019 6:06:02 PM Version By: Yevonne Pax RN Entered By: Benjaman Kindler on 06/25/2019 16:09:56 -------------------------------------------------------------------------------- Wound Assessment Details Patient Name: Date of Service: Kathryn Schmitt Schmitt, Kathryn UA NNE H. 06/24/2019 3:30  PM Medical Record Number: [id number] Patient Account Number: [id number] Date of Birth/Sex: Treating RN: 01/18/1956 (64 y.o. Freddy Finner Primary Care Tranisha Tissue: PA Zenovia Jordan, [place] Other Clinician: Referring Amrie Gurganus: Treating Lamondre Wesche/Extender: Valentino Saxon in Treatment: 2 Wound Status Wound Number: 4 Primary Etiology: Inflammatory Wound Location: Left T Great oe Wound Status: Healed - Epithelialized Wounding Event: Gradually Appeared Comorbid History: Lymphedema Date Acquired: 12/31/2018 Weeks Of Treatment: 2 Clustered Wound: No Photos Wound Measurements Length: (cm) Width: (cm) Depth: (cm) Area: (cm) Volume: (cm) 0 % Reduction in Area: 100% 0 % Reduction in Volume: 100% 0 Epithelialization: Large (67-100%) 0 Tunneling: No 0 Undermining: No Wound Description Classification: Full Thickness Without Exposed Support Structures Wound Margin: Fibrotic scar, thickened scar Exudate Amount: None Present Foul Odor After Cleansing: No Slough/Fibrino No Wound Bed Granulation Amount: None Present (0%) Exposed Structure Necrotic Amount: None Present (0%) Fascia Exposed: No Fat Layer (Subcutaneous Tissue) Exposed: No Tendon Exposed: No Muscle Exposed: No Joint Exposed: No Bone Exposed: No Electronic Signature(s) Signed: 06/25/2019 5:09:01 PM By: Benjaman Kindler EMT/HBOT Signed: 10/28/2019 5:26:37 PM By: Yevonne Pax RN Previous Signature: 06/24/2019 6:06:02 PM Version By: Yevonne Pax RN Entered By: Benjaman Kindler on 06/25/2019 16:09:35 -------------------------------------------------------------------------------- Vitals Details Patient Name: Date of Service: Kathryn Schmitt Schmitt, Kathryn UA NNE H. 06/24/2019 3:30 PM Medical Record Number: [id number] Patient Account Number: [id number] Date of Birth/Sex: Treating RN: July 29, 1955 (64 y.o. Freddy Finner Primary Care Hani Patnode: PA Zenovia Jordan, [place] Other Clinician: Referring Tanyia Grabbe: Treating Berlie Persky/Extender: Valentino Saxon in  Treatment: 2 Vital Signs Time Taken: 16:41 Temperature (F): 98.2 Height (in): 60 Pulse (bpm): 73 Weight (lbs): 280 Respiratory Rate (breaths/min): 20 Body Mass Index (BMI): 54.7 Blood Pressure (mmHg): 163/74 Reference Range: 80 - 120 mg / dl Electronic Signature(s) Signed: 08/13/2019 9:23:26 AM By: Karl Ito Entered By: Karl Ito on 06/24/2019 16:41:51
